# Patient Record
Sex: Female | Born: 1935 | Race: White | Hispanic: No | Smoking: Never smoker
Health system: Southern US, Community
[De-identification: ages and names within clinical notes are randomized; demographics above are authoritative.]

## PROBLEM LIST (undated history)

## (undated) DIAGNOSIS — I5042 Chronic combined systolic (congestive) and diastolic (congestive) heart failure: Secondary | ICD-10-CM

## (undated) DIAGNOSIS — N183 Chronic kidney disease, stage 3 unspecified: Secondary | ICD-10-CM

## (undated) DIAGNOSIS — I639 Cerebral infarction, unspecified: Secondary | ICD-10-CM

## (undated) DIAGNOSIS — E876 Hypokalemia: Secondary | ICD-10-CM

## (undated) DIAGNOSIS — I482 Chronic atrial fibrillation, unspecified: Secondary | ICD-10-CM

## (undated) DIAGNOSIS — G459 Transient cerebral ischemic attack, unspecified: Secondary | ICD-10-CM

## (undated) DIAGNOSIS — M1990 Unspecified osteoarthritis, unspecified site: Secondary | ICD-10-CM

## (undated) DIAGNOSIS — J45909 Unspecified asthma, uncomplicated: Secondary | ICD-10-CM

## (undated) DIAGNOSIS — I10 Essential (primary) hypertension: Secondary | ICD-10-CM

## (undated) HISTORY — PX: OTHER SURGICAL HISTORY: SHX169

## (undated) HISTORY — PX: JOINT REPLACEMENT: SHX530

---

## 1997-08-16 ENCOUNTER — Other Ambulatory Visit: Admission: RE | Admit: 1997-08-16 | Discharge: 1997-08-16 | Payer: Self-pay | Admitting: Family Medicine

## 1999-03-15 ENCOUNTER — Other Ambulatory Visit: Admission: RE | Admit: 1999-03-15 | Discharge: 1999-03-15 | Payer: Self-pay | Admitting: Family Medicine

## 1999-04-10 ENCOUNTER — Encounter: Payer: Self-pay | Admitting: Family Medicine

## 1999-04-10 ENCOUNTER — Encounter: Admission: RE | Admit: 1999-04-10 | Discharge: 1999-04-10 | Payer: Self-pay | Admitting: Family Medicine

## 2000-05-28 ENCOUNTER — Encounter: Payer: Self-pay | Admitting: Family Medicine

## 2000-05-28 ENCOUNTER — Encounter: Admission: RE | Admit: 2000-05-28 | Discharge: 2000-05-28 | Payer: Self-pay | Admitting: Family Medicine

## 2001-03-26 ENCOUNTER — Other Ambulatory Visit: Admission: RE | Admit: 2001-03-26 | Discharge: 2001-03-26 | Payer: Self-pay | Admitting: Family Medicine

## 2001-05-29 ENCOUNTER — Encounter: Payer: Self-pay | Admitting: Family Medicine

## 2001-05-29 ENCOUNTER — Encounter: Admission: RE | Admit: 2001-05-29 | Discharge: 2001-05-29 | Payer: Self-pay | Admitting: Family Medicine

## 2002-07-01 ENCOUNTER — Encounter: Payer: Self-pay | Admitting: Family Medicine

## 2002-07-01 ENCOUNTER — Encounter: Admission: RE | Admit: 2002-07-01 | Discharge: 2002-07-01 | Payer: Self-pay | Admitting: Family Medicine

## 2002-10-13 ENCOUNTER — Encounter: Payer: Self-pay | Admitting: Family Medicine

## 2002-10-13 ENCOUNTER — Encounter: Admission: RE | Admit: 2002-10-13 | Discharge: 2002-10-13 | Payer: Self-pay | Admitting: Family Medicine

## 2003-08-18 ENCOUNTER — Encounter: Admission: RE | Admit: 2003-08-18 | Discharge: 2003-08-18 | Payer: Self-pay | Admitting: Family Medicine

## 2004-08-09 ENCOUNTER — Ambulatory Visit (HOSPITAL_COMMUNITY): Admission: RE | Admit: 2004-08-09 | Discharge: 2004-08-09 | Payer: Self-pay | Admitting: Anesthesiology

## 2004-08-30 ENCOUNTER — Encounter: Admission: RE | Admit: 2004-08-30 | Discharge: 2004-08-30 | Payer: Self-pay | Admitting: Orthopedic Surgery

## 2004-10-17 ENCOUNTER — Encounter: Admission: RE | Admit: 2004-10-17 | Discharge: 2004-10-17 | Payer: Self-pay | Admitting: Family Medicine

## 2004-12-06 ENCOUNTER — Inpatient Hospital Stay (HOSPITAL_COMMUNITY): Admission: RE | Admit: 2004-12-06 | Discharge: 2004-12-09 | Payer: Self-pay | Admitting: Neurosurgery

## 2005-02-07 ENCOUNTER — Inpatient Hospital Stay (HOSPITAL_COMMUNITY): Admission: RE | Admit: 2005-02-07 | Discharge: 2005-02-13 | Payer: Self-pay | Admitting: Orthopedic Surgery

## 2005-07-23 ENCOUNTER — Encounter: Admission: RE | Admit: 2005-07-23 | Discharge: 2005-07-23 | Payer: Self-pay | Admitting: Neurosurgery

## 2005-10-19 ENCOUNTER — Encounter: Admission: RE | Admit: 2005-10-19 | Discharge: 2005-10-19 | Payer: Self-pay | Admitting: Family Medicine

## 2006-11-14 ENCOUNTER — Encounter: Admission: RE | Admit: 2006-11-14 | Discharge: 2006-11-14 | Payer: Self-pay | Admitting: Family Medicine

## 2007-04-28 ENCOUNTER — Inpatient Hospital Stay (HOSPITAL_COMMUNITY): Admission: RE | Admit: 2007-04-28 | Discharge: 2007-05-02 | Payer: Self-pay | Admitting: Orthopedic Surgery

## 2007-11-26 ENCOUNTER — Encounter: Admission: RE | Admit: 2007-11-26 | Discharge: 2007-11-26 | Payer: Self-pay | Admitting: Family Medicine

## 2007-12-05 ENCOUNTER — Encounter: Admission: RE | Admit: 2007-12-05 | Discharge: 2007-12-05 | Payer: Self-pay | Admitting: Family Medicine

## 2008-06-11 ENCOUNTER — Encounter: Admission: RE | Admit: 2008-06-11 | Discharge: 2008-06-11 | Payer: Self-pay | Admitting: Family Medicine

## 2008-11-29 ENCOUNTER — Encounter: Admission: RE | Admit: 2008-11-29 | Discharge: 2008-11-29 | Payer: Self-pay | Admitting: Family Medicine

## 2009-12-02 ENCOUNTER — Encounter: Admission: RE | Admit: 2009-12-02 | Discharge: 2009-12-02 | Payer: Self-pay | Admitting: Family Medicine

## 2010-06-20 NOTE — Op Note (Signed)
[name], [name]              ACCOUNT NO.:  [id number]   MEDICAL RECORD NO.:  [id number]          PATIENT TYPE:  INP   LOCATION:  2899                         FACILITY:  [facility]   PHYSICIAN:  Mila Homer. Sherlean Foot, M.D. DATE OF BIRTH:  02/16/1918   DATE OF PROCEDURE:  04/28/2007  DATE OF DISCHARGE:                               OPERATIVE REPORT   SURGEON:  Mila Homer. Sherlean Foot, M.D.   ASSISTANTArlys John D. Petrarca, P.A.-C.   ANESTHESIA:  General.   PREOPERATIVE DIAGNOSIS:  Right hip osteoarthritis.   POSTOPERATIVE DIAGNOSIS:  Right hip osteoarthritis.   PROCEDURE:  Right total arthroplasty.   INDICATIONS FOR PROCEDURE:  The patient is an 77 year old white female  with failure of conservative measures for right hip osteoarthritis.  She  had already had a well functioning left hip done by myself and done 2  years ago.  Informed consent was obtained.   DESCRIPTION OF PROCEDURE:  The patient was placed supine under general  anesthesia, and then placed in the left lateral decubitus position.  The  hip was prepped and draped in the usual sterile fashion.   An incision was made approximately 6 inches in length with a #10 blade.  Cautery was used to obtain hemostasis.  I then incised the fascia lata  along the length of the incision with the cautery in place, and external  retractor in place.  I then created an anterior sleeve of tissue of the  anterior 1/2 of the gluteus medius, vastus lateralis, and minimus  tendon; tied with 3 stay sutures, and elevated anteriorly.  I then  performed an anterior capsulectomy.  I then used the cutting guide and  marked out the femoral neck cut, and made that cut with a reciprocating  saw.   I then placed retractors anterior and posterior to the acetabulum, and  switched sides of the table with my PA.  I removed the labrum  circumferentially, and then sequentially reamed up to 52 mm.  I put a 52  trial in place, liked the fit, so I put a 54-mm no  holes, no spiked cup.  This was a fiber mesh 54 mm Zimmer cup.  I then placed a neutral liner  accepting of a 32 mm head.  I then switched back to the back side of the  table.  We flexed the leg into a sterile pouch off the anterior side of  the table.   I then used a Mueller retractor to elevate the cut surface of the  femoral neck, then used the canal finder and side-biting reamer.  I then  sequentially reamed up to 14 mm, broached to 14, and trialed with a #0  head and had good stability.  I then removed the trial component, and  copiously irrigated.  I then put down a fully porous coated size 14  implant, and tamped a +0 x 32 mm ball onto the clean Morse taper and  relocated the hip.   I then repaired the lateralis medius-minimus sleeve through drill holes  in the trochanter, oversewn with figure-of-eight #2 Tevdek sutures.  I  closed the fascia lata with running #1 Vicryls, deep soft tissues to  buried #0 Vicryl, subcuticular Vicryl stitch, skin staples, and strap  dressing.   ESTIMATED BLOOD LOSS:  300 mL   COMPLICATIONS:  None.   DRAINS:  None.           ______________________________  Mila Homer. Sherlean Foot, M.D.     SDL/MEDQ  D:  04/28/2007  T:  04/28/2007  Job:  [id number]

## 2010-06-23 NOTE — Op Note (Signed)
[name], [name]              ACCOUNT NO.:  [id number]   MEDICAL RECORD NO.:  [id number]          PATIENT TYPE:  INP   LOCATION:  5034                         FACILITY:  [facility]   PHYSICIAN:  Mila Homer. Sherlean Foot, M.D. DATE OF BIRTH:  1918/02/18   DATE OF PROCEDURE:  02/07/2005  DATE OF DISCHARGE:                                 OPERATIVE REPORT   PREOPERATIVE DIAGNOSIS:  Left hip avascular necrosis.   POSTOPERATIVE DIAGNOSIS:  Left hip avascular necrosis.   OPERATION/PROCEDURE:  Left total hip arthroplasty.   SURGEON:  Mila Homer. Sherlean Foot, M.D.   ASSISTANT:  __________  P.A.-C.   ANESTHESIA:  General.   INDICATIONS:  The patient is an 77 year old white female with medical  clearance, failure of conservative measures of osteoarthritis and an  informed consent obtained.   DESCRIPTION OF PROCEDURE:  The patient was taken to the operating room and  administered general anesthesia. She was then administered Foley  catheterization.  She was then placed in the right-down, left-up lateral  decubitus position.  At this point the left hip was prepped and draped in  the usual sterile fashion.  A #10 blade was used to make a curvilinear  incision and centered on the greater trochanter.  I then used a cautery to  obtain his hemostasis.  I then split the fascia lata along the length of the  incision and held that interval of open with a Charnley retractor.  At this  point, abductors and vastus medialis was identified.  The anterior one-half  of the vastus medialis was taken off of the bone sharply with a #10 blade  and a sleeve with the anterior one-third of the gluteus medius and all the  gluteus minimus and these were tagged with #2 Tevdek sutures.  I then  externally rotated the leg and could easily palpate the hip joint.  I then  performed an anterior hip capsulectomy.  At this point I marked out the neck  cut with the cutting guide.  I used a reciprocating saw to make the femoral  neck  cut and removed the neck and head without dislocating the hip.  I then  changed sides of the table with my P.A.-C.  I then placed a Homan rectractor  anterior and posterior to the acetabulum and cleaned out the acetabulum of  its labrum and ligamentum teres.  I then sequentially reamed from 46 mm up  to 52 mm.  At this point I put a 52 trial in.  It had excellent fit.  I then  chose a 54 mm cup and tamped in a fibromesh cup with no holes and no spikes.  At this point I switched back to the back side of the table.  We externally  rotated the femur off into a sterile pouch on the anterior side of the  table.  I used the Muller retractor to elevate the cut surface of the  femoral neck up out of the wound.  I then used the canal finder to find the  femoral canal.  I used the side-biting reamer to ream laterally into the  trochanter and then sequentially reamed up to 14 mm with the reamers.  I  then broached sequentially from 11 up to 14.  Had good rotational fit.  I  then used the __________  planer and trialed with multiple heads with a  standard liner already in place.  At this point, with this 3 + 3 size head  in place, this afforded excellent stability and recreated normal leg length.  I then dislocated the hip, removed the trial prosthesis and irrigated  copiously.  I then tamped down a fully porous-coated, size 14 stem in  neutral version.  This matched the anatomic version of the acetabulum.  I  then tamped on a +3, 5 x 32 mm head and located the hip.  I again took it  through a trial and could not dislocate the hip.  At this point I repaired  the vastus lateralis and gluteus medius and minimus sleeve through drill  holes in the trochanter and then oversewed that interval with several figure-  of-eight Tevdek #2 sutures.  I then closed the fascia lata with a running  #1 Vicryl.  Closed the deep soft tissue with interrupted 0 Vicryls, a  subcuticular 2-0 Vicryl layer and skin staples.  I  dressed it with Adaptic,  4 x 4's, ABDs, and sterile Ioban drape.  Estimated blood loss was 300 mL.  No complications.  No drains.           ______________________________  Mila Homer Sherlean Foot, M.D.     SDL/MEDQ  D:  02/07/2005  T:  02/08/2005  Job:  [id number]

## 2010-06-23 NOTE — Op Note (Signed)
[name], [name]              ACCOUNT NO.:  [id number]   MEDICAL RECORD NO.:  [id number]          PATIENT TYPE:  INP   LOCATION:  3314                         FACILITY:  [facility]   PHYSICIAN:  Cristi Loron, M.D.DATE OF BIRTH:  February 03, 1919   DATE OF PROCEDURE:  12/06/2004  DATE OF DISCHARGE:                                 OPERATIVE REPORT   PREOPERATIVE DIAGNOSIS:  L1-2, 2-3, 3-4, 4-5, 5-1 spinal stenosis,  degenerative disk disease, lumbar radiculopathy with lumbago.   POSTOPERATIVE DIAGNOSIS:  L1-2, 2-3, 3-4, 4-5, 5-1 spinal stenosis,  degenerative disk disease, lumbar radiculopathy with lumbago.   OPERATION PERFORMED:  L1, 2, 3, 4, and 5 decompressive laminectomy and  bilateral foraminotomy using microdissection.   SURGEON:  Cristi Loron, M.D.   ASSISTANT:  Hewitt Shorts, M.D.   ANESTHESIA:  General endotracheal.   ESTIMATED BLOOD LOSS:  200 mL.   SPECIMENS:  None.   DRAINS:  None.   COMPLICATIONS:  None.   INDICATIONS FOR PROCEDURE:  The patient is an 77 year old white female who  suffered from back and bilateral hip and leg pain consistent with neurogenic  claudication.  She was worked up with a lumbar MRI which demonstrated she  had spinal stenosis from L1-2 all the way down to L5-S1.  I discussed the  various treatment options with the patient and her family including surgery.  The patient has weighed the risks, benefits and alternatives to surgery and  decided to proceed with decompressive lumbar laminectomy.   DESCRIPTION OF PROCEDURE:  The patient was brought to the operating room by  the anesthesia team.  General endotracheal anesthesia was induced.  The  patient was then carefully turned to the prone position on the Wilson frame.  Her lumbosacral region was then prepared with Betadine scrub and Betadine  solution and sterile drapes were applied.  I then injected the area to be  incised with Marcaine with epinephrine solution.  I used a  scalpel to make a  midline incision from L1 to the upper sacrum.  I used electrocautery to  perform bilateral subperiosteal dissection exposing the spinous process and  lamina from L1 down to the upper sacrum.  We inserted the McCullough  retractor and cerebellar retractor for exposure and then we obtained  intraoperative radiograph to confirm our location.  I then used the scalpel  to incise the L1-2, 2-3, 3-4, 4-5 and 5-1 interspinous ligament and I used a  Leksell rongeur to remove the spinous process at L2, 3, 4, and 5.  We then  used a high speed drill to perform bilateral laminotomies at L5, 4, 3, 2 and  1.  We then brought the operating microscope into the field and under its  magnification and illumination completed the microdissection/decompression.  We then used the Kerrison punch to complete the laminectomies at L5, 4, 3  and 2 and widen the laminotomies at L1.  We removed the ligamentum flavum at  L1-2, 2-3, 3-4, 4-5 and 5-1.  We then performed foraminotomies about the  bilateral L2, 3, 4, 5, and S1 nerve roots removing the excess ligamentum  flavum from the lateral recesses.  We then palpated along the ventral  surface of the thecal sac and inspected the L1-2, 2-3, 3-4, 4-5 and 5-1  intervertebral disks.  They were bulging.  There was no significant  herniations and at this point the thecal sac and the bilateral L2, 3, 4, 5,  and S1 nerve roots were well decompressed.  We then obtained hemostasis  using bipolar electrocautery.  We irrigated the wound out with bacitracin  solution, removed the solution, then removed the retractors and then  reapproximated the patient's thoracolumbar fascia with interrupted #1 Vicryl  sutures, subcutaneous tissue with interrupted 2-0 Vicryl suture and the skin  with Steri-Strips and benzoin. The wound was then coated with bacitracin  ointment, sterile dressing was applied, the drapes were removed.  The  patient was subsequently returned to supine  position where she was extubated  by the anesthesia team and transported to the post anesthesia care unit in  stable condition.  All sponge, needle and instrument counts were correct at  the end of the case.      Cristi Loron, M.D.  Electronically Signed     JDJ/MEDQ  D:  12/06/2004  T:  12/07/2004  Job:  [id number]

## 2010-06-23 NOTE — Discharge Summary (Signed)
[name], [name]              ACCOUNT NO.:  [id number]   MEDICAL RECORD NO.:  [id number]          PATIENT TYPE:  INP   LOCATION:  3704                         FACILITY:  [facility]   PHYSICIAN:  Cristi Loron, M.D.DATE OF BIRTH:  [birth year]   DATE OF ADMISSION:  12/06/2004  DATE OF DISCHARGE:  12/09/2004                                 DISCHARGE SUMMARY   BRIEF HISTORY:  The patient is an 77 year old white female, who suffered  from back and bilateral hip and leg pain consistent with neurogenic  claudication.  She was worked up with a lumbar MRI, which demonstrates she  has spinal stenosis at L1-2, all the way down to L5-S1.  I discussed the  various treatment options with the patient and her family including surgery.  The patient has weighed the risk, benefits, and alternatives to surgery and  has decided to proceed with a decompressive lumbar laminectomy.   For further details of this admission, please refer to the typed History and  Physical.   HOSPITAL COURSE:  I admitted the patient to [facility] on December 06, 2004, with the diagnosis of lumbar spinal stenosis.  In the preop holding  room, the anesthesiologist was concerned about her cardiovascular status so  we got a Cardiology consult.  The patient was kindly seen by Dr. Elsie Lincoln.  They worked her up with an echocardiogram, which turned out okay.  The  patient then was cleared for surgery.  The patient subsequently underwent a  decompressive laminectomy from L1 down to L5 performed by me.  The surgery  went well (for full details of this operation, please refer to typed  Operative Note).   POSTOPERATIVE COURSE:  The patient's postoperative course was remarkable  only as follows.  She did go into atrial fibrillation.  She was seen by the  cardiologist and they treated her medically.  The patient was started on  Cardizem-CD 180 mg p.o. daily, and by December 09, 2004, the patient was felt  to be stable for  discharge to home.   FINAL DIAGNOSES:  1.  Lumbar spinal stenosis.  2.  Degenerative disk disease.  3.  Atrial fibrillation.  4.  Hypertension.  5.  Asthma.   PROCEDURES PERFORMED:  1.  L1 to L5 decompressive laminectomy.  2.  Echocardiogram.      Cristi Loron, M.D.  Electronically Signed     JDJ/MEDQ  D:  01/18/2005  T:  01/21/2005  Job:  [id number]   cc:   Madaline Savage, M.D.  Fax: [phone]

## 2010-06-23 NOTE — H&P (Signed)
[name], DOBESH              ACCOUNT NO.:  [id number]   MEDICAL RECORD NO.:  [id number]            PATIENT TYPE:   LOCATION:                                 FACILITY:   PHYSICIAN:  Mila Homer. [name], M.D. DATE OF BIRTH:  1918-04-04   DATE OF ADMISSION:  02/07/2005  DATE OF DISCHARGE:                                HISTORY & PHYSICAL   IDENTIFICATION:  [name] is an 77 year old white widowed female,  retired Optician, dispensing.   CHIEF COMPLAINT:  Painful left hip.   HISTORY OF PRESENT ILLNESS:  This patient has had increasing left hip pain  over the past 6 months which has worsened after her spine surgery in  November of 2006.  She has had injections intra-articularly which has helped  only transiently.  She has now failed conservative treatment and is  indicated for operative intervention.  Radiographically, she was noted to  have avascular necrosis of her left hip.  Now indicated for left total hip  arthroplasty.   PAST MEDICAL HISTORY:  In general, health is good.   HOSPITALIZATIONS:  December 06, 2004 for lumbar surgery as above.   MEDICATIONS:  1.  Vicodin 5/500 one q.4h. p.r.n. pain.  2.  Aleve one every 4 hours p.r.n.  3.  Generic eye drops for FML four times daily.  4.  Caltrate 600 plus D daily.  5.  Advair p.r.n.  6.  Vaseretic 10/25 mg tablets daily.  7.  Cardizem 120 mg tablets daily.  8.  Aspirin one daily.   ALLERGIES:  None known to drugs.  She does have an allergy to shrimp   REVIEW OF SYSTEMS:  A 14-point review of systems is positive for  hypertension, for which she takes Vaseretic and Cardizem.  She also has a  history of asthma, for which she uses Advair, possibly only once a week.  She does have partial dentures, and she does wear glasses.  All other  symptomatology is denied.   FAMILY HISTORY:  Mother died from heart failure, and father died from kidney  disease.  One deceased brother and 1 sister who is alive.   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  She denies the use of  tobacco or alcohol.  Her family physician is Dr. Cira Rue Hamrick.  Phone  number is [phone].   PHYSICAL EXAMINATION:  GENERAL:  An 77 year old white female, well-  developed, well-nourished, alert, pleasant, cooperative, in moderate  distress secondary to left hip and groin pain.  VITAL SIGNS:  She is 5 feet, 3 inches, weight 155 pounds.  Temperature is  98.5, pulse 80, respirations 18, and blood pressure 130/72.  HEENT:  Head is normocephalic.  Eyes - pupils equal, round and reactive to  light and accommodation.  Extraocular movements intact.  Ears, nose and  throat were benign.  NECK:  Supple.  No thyromegaly.  CHEST:  Good expansion.  LUNGS:  Essentially clear.  CARDIAC:  Regular rhythm and rate.  Normal S1 and S2.  No discrete murmurs,  rubs or gallops appreciated.  Pulses were 1+ bilateral symmetric in the  lower extremities.  ABDOMEN:  Obese, soft, nontender.  No masses palpable.  Normal bowel sounds  present.  GENITAL/RECTAL/BREAST:  Not performed and not indicated for the procedure.  CNS:  She is oriented x3.  Cranial nerves II-XII grossly intact.  MUSCULOSKELETAL:  She has decreased range of motion of the left hip.  She  barely has 10 degrees of internal and external rotation at best.  She has  flexion to 95 degrees.  Sensation is intact to light touch distally.  Calf  is supple, nontender.   CLINICAL IMPRESSION:  1.  Avascular necrosis of the left hip.  2.  History of hypertension.  3.  History of asthma.   RECOMMENDATIONS:  At this time, we feel that she is a candidate for total  hip arthroplasty on the left.  The procedure, risks and benefits have been  fully explained.  She is understanding.  She will proceed with surgical  intervention in the near future.      Oris Drone Petrarca, P.A.-C.    ______________________________   Mila Homer. [name], M.D.    BDP/MEDQ  D:  01/26/2005  T:  01/27/2005  Job:  [id number]

## 2010-06-23 NOTE — Discharge Summary (Signed)
NAMEREANNE, Carol              ACCOUNT NO.:  [id number]   MEDICAL RECORD NO.:  [id number]          PATIENT TYPE:  INP   LOCATION:  2030                         FACILITY:  [facility]   PHYSICIAN:  Mila Homer. Sherlean Foot, M.D. DATE OF BIRTH:  Apr 05, 1918   DATE OF ADMISSION:  02/07/2005  DATE OF DISCHARGE:  02/13/2005                                 DISCHARGE SUMMARY   ADMISSION DIAGNOSIS:  Avascular necrosis, left hip.   DISCHARGE DIAGNOSES:  1.  Avascular necrosis, left hip.  2.  Atrial fibrillation.  3.  Chronic airway obstructive disease.  4.  Acute postoperative anemia.  5.  Hypertension.   PROCEDURE:  Left total hip replacement.   HISTORY:  This patient has had increased left hip pain over the past 6  months which has worsened after her spine surgery in November 2006.  She had  an intra-articular injection which helped only transiently.  She has now  failed conservative treatment and is now indicated for operative  intervention.  Radiographically, she has avascular necrosis of the left hip  and is now indicated for left total hip arthroplasty.   HOSPITAL COURSE:  [age]-year-old female admitted February 07, 2005.  After appropriate laboratory studies were obtained and 1 g of Ancef IV on  call to the operating room, she was taken to the operating room where she  underwent a left total hip replacement.  She tolerated the procedure.  A  Foley was placed intraoperatively.  A Dilaudid PCA pump in a reduced dose  was used for postop pain management.  She was continued on Ancef 1 g IV q.8  h. x3 doses.  Abduction pillow was used postoperatively.  She was started on  Lovenox 30 mg subcu q.12 h.  Consultation with PT and Care Management were  performed.  She was allowed to be weightbearing as tolerated in Physical  Therapy.  On the 4th, she was weaned to oral pain management and pills.  She  was also instructed in Lovenox.  STAT EKG was obtained on February 09, 2005  revealing atrial  fibrillation.  She was then placed on 30 mg or Cardizem  p.o. x1.  She was transferred to Telemetry and the therapeutic dose of  Lovenox for atrial fibrillation was started.  The long-acting Cardizem was  discontinued and she was started on Cardizem 60 mg p.o. q.6 h.  Lopressor 25  mg twice daily was also begun.  Portable chest x-ray was ordered.  Magnesium  sulfate of 1 g was to be given on February 10, 2005.  Lovenox 70 mg subcu q.12  h. was the dosing for the therapeutic Lovenox.  On the 7th, she had an  additional 2 g of magnesium sulfate given.  Potassium chloride of 40 mEq was  also given that day.  The remainder of her hospital course was uneventful  and we were able to discharge on the 9th after clearance by Cardiology.  She  was discharged in improved condition.   ACCESSORY CLINICAL DATA:  EKG of February 09, 2005 revealed atrial  fibrillation with rapid ventricular response, nonspecific ST and T wave  abnormalities, probably digitalis effect.  When compared with EKG of  December 09, 2004, the previous EKG changes were present.  Since last  tracing, there was conversion back to atrial fibrillation with rapid  ventricular response.   Chest x-ray, December 01, 2004, revealed no cardiopulmonary disease.   Pelvis of February 07, 2005 revealed anatomic alignment, post left hip  arthroplasty without acute complicating features, moderate osteoarthritis in  the right hip.   Portable chest of February 09, 2005 reveals cardiomegaly and pulmonary  vascular congestion.   LABORATORY STUDIES:  Admitted with a hemoglobin of 14.1, hematocrit 42.1%,  white count 9200 and platelets 355,000.  Discharge hemoglobin was 9.1,  hematocrit 26.8%, white count 9200 and platelets were 372,000.  Preop pro  time was 13.6 with an INR of 1.0 and a PTT of 28.  Pro time of February 13, 2005 revealed PT of 0.4 seconds with an INR of 1.5.  Preop chemistries  revealed a sodium of 142, potassium 4.0, chloride 102, CO2 29,  glucose 111,  BUN 35, creatinine 1.2, calcium 10.2, total protein 6.5, albumin 4.0, AST  23, ALT 17, ALP 73, total bilirubin 1.0.  Discharge sodium was 133,  potassium 3.6, chloride 99, CO2 28, glucose 105, BUN 12, creatinine 0.9,  calcium 9.0.  Magnesium of February 09, 2005 was 1.3, on February 11, 2005 was  1.4, on February 12, 2005 was 1.8.  BNP of February 09, 2005 revealed 222.0.  TSH the same day was 3.886.  Urinalysis of February 01, 2005 was noted to  have 3-6 white cells, no red cells, a few bacteria.  UA of February 07, 2005  revealed moderate leukocyte esterase with rare epithelials and 3-6 white  cells.  Blood type was O-negative, antibody screen negative.   DISCHARGE MEDICATIONS:  1.  Given a prescription for Percocet 5/325 mg one or two tabs every 4 hours      as needed for pain.  2.  Coumadin as directed by Dr. Elsie Lincoln; 5-mg tablets were ordered.  3.  Lovenox 20 mg -- inject subcutaneously every 6 a.m. and 6 p.m. until      Coumadin is therapeutic.  4.  Robaxin 500 mg one to two tabs every 6 hours as needed for spasm.  5.  Lopressor 50 mg one twice a day.  6.  Cardizem CD 300 mg one daily.   DISCHARGE INSTRUCTIONS:  Rehab and home health through [facility] for PT were  ordered.   DIET:  There were no restrictions on diet.   ACTIVITY:  No driving or lifting for 4-6 weeks.  Crutches, weightbearing as  tolerated on the left hip.   FOLLOWUP:  She will follow back up with Dr. Sherlean Foot 2 weeks from surgery, with  Dr. Elsie Lincoln on March 06, 2005 at 2:45 p.m.   CONDITION ON DISCHARGE:  Discharged in improved condition.      Oris Drone Petrarca, P.A.-C.    ______________________________  Mila Homer. Sherlean Foot, M.D.    BDP/MEDQ  D:  04/09/2005  T:  04/10/2005  Job:  [id number]

## 2010-06-23 NOTE — Discharge Summary (Signed)
[name], [name]              ACCOUNT NO.:  [id number]   MEDICAL RECORD NO.:  [id number]          PATIENT TYPE:  INP   LOCATION:  5035                         FACILITY:  [facility]   PHYSICIAN:  [name], M.D. DATE OF BIRTH:  06-02-1918   DATE OF ADMISSION:  04/28/2007  DATE OF DISCHARGE:  05/02/2007                               DISCHARGE SUMMARY   ADMISSION DIAGNOSES:  1. End-stage osteoarthritis right hip status post left hip      replacement.  2. Hypertension.  3. Chronic atrial fibrillation on Coumadin.  4. Chronic low back pain.  5. 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  6. Preoperative urinary tract infection on Macrodantin.   DISCHARGE DIAGNOSES:  1. End-stage osteoarthritis, right hip status post right total hip      arthroplasty.  2. History of left total hip arthroplasty.  3. Acute blood loss anemia, secondary to surgery.  4. Preoperative urinary tract infection with intolerance to      Macrodantin, now on Cipro.  5. Chronic atrial fibrillation with increased heart rate.  6. Hyponatremia.  7. Constipation.  8. Leukocytosis, now improved.  9. Hypertension.  10.Chronic low back pain.  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   SURGICAL PROCEDURES:  On April 28, 2007, [name] underwent a right  total hip arthroplasty by Dr. Raymon Mutton assisted by Jacqualine Code, PA-C.  She had a trilogy acetabular system shell without  holes, size 54 mm outer diameter placed with a trilogy liner longevity  cross-linked poly 32-mm inner diameter, 50-54 mm outer diameter.  A  VerSys femoral stem beaded full coat collared 12/14 neck taper, standard  neck offset size 14 body femoral stem with a VerSys femoral head 12/14  taper 32-mm diameter of +0 neck length.   COMPLICATIONS:  None.   CONSULTANT:  1. Pharmacy consult for Coumadin therapy April 28, 2007.  2. Cardiology consult by Dr. Orvan Falconer, April 29, 2007, in addition to      a physical  therapy consult.  3. Case management occupational therapy consult, April 30, 2007.   HISTORY OF PRESENT ILLNESS:  This is an 77 year old white female.  The  patient presented to Dr. Sherlean [name] with history of a left hip replacement in  January 2007 and lumbar surgery in November 2006.  She has had a 23-month  history of gradual onset progressive right hip, pain with no injury or  surgery.  Pain is a constant ache in the right buttock with radiation  into the side past the knee.  The hip pops, grinds, gives that she  cannot sleep on the right side.  Pain increases with prolonged walking  and decreases with rest and Lorcet.  She had been using a cane,  but now  using a walker due to the pain.  She has failed conservative treatment  and x-rays show end-stage arthritic changes of the hip.  Because of this  she is presenting for a right hip replacement.   HOSPITAL COURSE:  [name] tolerated her surgical procedure well,  without immediate postoperative complications.  She was transferred  to  5000.  She was resumed back on her Coumadin covered with Lovenox still  the Coumadin was therapeutic.  Postop day #1 T-max was 99.5, pulse was  up at 124.  White count 12.8, hemoglobin 11.7, hematocrit 34.7.  Mepilex  is intact at the right hip.  She noted to tell us at that time that she  could not tolerate a Macrodantin for her preop UTI and was switched to  Cipro at that time.  Repeat UA and C&S were obtained and Dr. Elsie Lincoln was  consulted due to the elevated heart rate with her Afib.  They did not  feel telemetry bed was necessary at that time, so she was kept on the  orthopedic floor.   Postop day #2 T-max 101.4, vitals were stable.  White count up at 18,  hemoglobin 11.7, hematocrit 34.8.  PT 20.3, INR 1.7.  Leg was  neurovascularly intact.  She was switched to p.o. pain meds and  continued on therapy.   The patient did make slow progress over the next several days.  Her  heart rate seemed to  stabilize.  No further cardiac intervention was  needed.  On March 26, her T-max was 99.1, pulse 102, vitals were stable.  White count had dropped to 14.7, hemoglobin 10.6, hematocrit 31.  She  was having some issues with constipation that was treated with laxatives  and plans were made for probable discharge to home the next day.   On the 27th, her pain was well controlled.  T-max 99.6, vitals stable.  White count continued to fall at 11.7, hemoglobin 10.1, hematocrit 30.4,  right hip incision was well approximated with staples.  Mepilex dressing  was changed.  She was doing well enough with therapy that was I was felt  she was ready for discharge to home.  Constipation has been treated  effectively the day before and she was able to be discharged to home at  that time.   DISCHARGE INSTRUCTIONS:   DIET:  She is to resume her regular prehospitalization diet.   MEDICATIONS:  She may resume her home meds as follows:  1. No hydrocodone __________  at this time.  2. Diltiazem 360 mg p.o. q.a.m.  3. Amlodipine 5 mg p.o. q.a.m.  4. Coumadin dosing at this time.  5. Lovenox 60 mg nightly.  She is to hold at this time.  6. Caltrate 1 tablet p.o. q.a.m.  7. Prednisolone eye drops 1 drop in right eye daily, she is to      continue.  8. Metoprolol 50 mg half a tablet p.o. b.i.d.  9. Lisinopril 10 mg p.o. q.a.m.   ADDITIONAL MEDICATIONS:  At this time include:  1. Coumadin 2.5 mg p.o. q.a.m. with no Coumadin on Tuesdays.  2. Cipro 250 mg p.o. b.i.d. 10 with no refill.  3. Norco 5/325 1-2 tablets p.o. q.4 h. p.r.n. for pain, 60, with no      refill.   ACTIVITY:  She can be out of bed.  Weightbearing as tolerated on the  right leg with use of walker.  No lifting or driving for 6 weeks and she  is to increase her activity slowly.  She is to have home health PT per  [facility].  Please see her blue total hip discharge sheet  for further activity instructions.   WOUND CARE:   She may shower after no drainage from the wound for 2 days.  Please see the blue total hip discharge sheet  for further wound care  instructions.   FOLLOW-UP:  She is to follow up with Dr. Sherlean [name] in our office on Tuesday  April 7 and needs to call [phone] for that appointment.  She is to  follow up with her cardiologist Dr. Elsie Lincoln in 1 month.   LABORATORY DATA:  X-ray taken of the right hip on March 23, showed the  right hip, femoral and acetabular components in satisfactory position  with no appearing prostatic fractures.   Hemoglobin/hematocrit ranged from 15.1 and 45.2 on the 19th to 10.1 and  30.4 on the 27th.  White count went from 10.5 on the 19th to 18 on the  25th to 11.7 on the 27th.  Platelets remained within normal limits.   PT and INR were 51.2 on the 19th and then went to 24.7 and 2.2 on the  27th.   Sodium dropped to a low of 134 on the 25th.  Glucose ranged from 120 on  the 19th, to 152 on the 24th, to 110 on the 26th.  BUN and creatinine  were 28 and 1.02 on the 19th and then were within normal limits.  Estimated GFR on the 19th was 1551 and the rest of the time it was  within normal limits.   Urinalysis on March 19, showed cloudy, amber urine, 100 megs per dL  protein, positive nitrate, large leukocyte esterase, few epithelials,  too numerous to count white cells, too red cells and many bacteria.  Repeat UA on the 24th showed 30 megs per dL protein, negative nitrate,  small leukocyte esterase, rare epithelials, 7-10 white cells, 3-6 red  cells and rare bacteria.  There was no growth from the urine cultures.      Legrand Pitts Duffy, P.A.    ______________________________  [name], M.D.    KED/MEDQ  D:  05/26/2007  T:  05/27/2007  Job:  [id number]   cc:   Madaline Savage, M.D.

## 2010-10-30 LAB — URINE MICROSCOPIC-ADD ON

## 2010-10-30 LAB — CBC
HCT: 31 — ABNORMAL LOW
HCT: 34.7 — ABNORMAL LOW
HCT: 34.8 — ABNORMAL LOW
HCT: 45.2
Hemoglobin: 10.1 — ABNORMAL LOW
Hemoglobin: 11.7 — ABNORMAL LOW
Hemoglobin: 11.7 — ABNORMAL LOW
MCHC: 33.3
MCHC: 33.4
MCHC: 33.6
MCHC: 34.3
MCV: 87.4
MCV: 87.6
MCV: 88.1
Platelets: 208
Platelets: 313
RDW: 14.1
RDW: 14.3
RDW: 14.3
RDW: 14.6
WBC: 10.5

## 2010-10-30 LAB — URINALYSIS, ROUTINE W REFLEX MICROSCOPIC
Bilirubin Urine: NEGATIVE
Glucose, UA: NEGATIVE
Glucose, UA: NEGATIVE
Hgb urine dipstick: NEGATIVE
Ketones, ur: NEGATIVE
Nitrite: POSITIVE — AB
Urobilinogen, UA: 1
pH: 6.5

## 2010-10-30 LAB — BASIC METABOLIC PANEL
BUN: 14
CO2: 28
CO2: 28
CO2: 29
Chloride: 102
Chloride: 104
Chloride: 98
Creatinine, Ser: 0.73
Creatinine, Ser: 0.76
Glucose, Bld: 142 — ABNORMAL HIGH
Glucose, Bld: 152 — ABNORMAL HIGH
Potassium: 3.8
Sodium: 134 — ABNORMAL LOW
Sodium: 138

## 2010-10-30 LAB — DIFFERENTIAL
Basophils Absolute: 0
Eosinophils Absolute: 0.1
Eosinophils Relative: 1
Monocytes Absolute: 0.6
Monocytes Relative: 6
Neutrophils Relative %: 61

## 2010-10-30 LAB — APTT: aPTT: 31

## 2010-10-30 LAB — COMPREHENSIVE METABOLIC PANEL
AST: 25
Albumin: 4.2
Alkaline Phosphatase: 108
CO2: 27
Glucose, Bld: 120 — ABNORMAL HIGH
Total Bilirubin: 1

## 2010-10-30 LAB — PROTIME-INR
INR: 1
INR: 1.2
INR: 2.2 — ABNORMAL HIGH
Prothrombin Time: 22.3 — ABNORMAL HIGH
Prothrombin Time: 24.7 — ABNORMAL HIGH

## 2010-10-30 LAB — CROSSMATCH: Antibody Screen: NEGATIVE

## 2010-10-30 LAB — URINE CULTURE: Colony Count: NO GROWTH

## 2010-11-06 ENCOUNTER — Other Ambulatory Visit: Payer: Self-pay | Admitting: Family Medicine

## 2010-11-06 DIAGNOSIS — Z1231 Encounter for screening mammogram for malignant neoplasm of breast: Secondary | ICD-10-CM

## 2010-12-04 ENCOUNTER — Ambulatory Visit
Admission: RE | Admit: 2010-12-04 | Discharge: 2010-12-04 | Disposition: A | Discharge status: Home / Self Care | Payer: Medicare Other | Source: Ambulatory Visit | Attending: Family Medicine | Admitting: Family Medicine

## 2010-12-04 DIAGNOSIS — Z1231 Encounter for screening mammogram for malignant neoplasm of breast: Secondary | ICD-10-CM

## 2011-03-19 ENCOUNTER — Encounter (HOSPITAL_COMMUNITY): Payer: Self-pay

## 2011-03-19 ENCOUNTER — Other Ambulatory Visit: Payer: Self-pay

## 2011-03-19 ENCOUNTER — Inpatient Hospital Stay (HOSPITAL_COMMUNITY)
Admission: EM | Admit: 2011-03-19 | Discharge: 2011-03-23 | DRG: 308 | Disposition: A | Discharge status: Skilled Nursing Facility | Payer: Medicare Other | Attending: Internal Medicine | Admitting: Internal Medicine

## 2011-03-19 ENCOUNTER — Emergency Department (HOSPITAL_COMMUNITY): Payer: Medicare Other

## 2011-03-19 DIAGNOSIS — J45909 Unspecified asthma, uncomplicated: Secondary | ICD-10-CM | POA: Diagnosis present

## 2011-03-19 DIAGNOSIS — R531 Weakness: Secondary | ICD-10-CM

## 2011-03-19 DIAGNOSIS — N390 Urinary tract infection, site not specified: Secondary | ICD-10-CM | POA: Diagnosis present

## 2011-03-19 DIAGNOSIS — R4701 Aphasia: Secondary | ICD-10-CM | POA: Diagnosis present

## 2011-03-19 DIAGNOSIS — Z7901 Long term (current) use of anticoagulants: Secondary | ICD-10-CM

## 2011-03-19 DIAGNOSIS — I10 Essential (primary) hypertension: Secondary | ICD-10-CM | POA: Diagnosis present

## 2011-03-19 DIAGNOSIS — E876 Hypokalemia: Secondary | ICD-10-CM | POA: Diagnosis not present

## 2011-03-19 DIAGNOSIS — M129 Arthropathy, unspecified: Secondary | ICD-10-CM | POA: Diagnosis present

## 2011-03-19 DIAGNOSIS — I5031 Acute diastolic (congestive) heart failure: Secondary | ICD-10-CM | POA: Diagnosis present

## 2011-03-19 DIAGNOSIS — M1990 Unspecified osteoarthritis, unspecified site: Secondary | ICD-10-CM | POA: Diagnosis present

## 2011-03-19 DIAGNOSIS — I509 Heart failure, unspecified: Secondary | ICD-10-CM | POA: Diagnosis present

## 2011-03-19 DIAGNOSIS — I4891 Unspecified atrial fibrillation: Principal | ICD-10-CM | POA: Diagnosis present

## 2011-03-19 DIAGNOSIS — Z96649 Presence of unspecified artificial hip joint: Secondary | ICD-10-CM

## 2011-03-19 DIAGNOSIS — Z66 Do not resuscitate: Secondary | ICD-10-CM | POA: Diagnosis present

## 2011-03-19 DIAGNOSIS — G2581 Restless legs syndrome: Secondary | ICD-10-CM | POA: Diagnosis present

## 2011-03-19 DIAGNOSIS — D649 Anemia, unspecified: Secondary | ICD-10-CM | POA: Diagnosis present

## 2011-03-19 DIAGNOSIS — G459 Transient cerebral ischemic attack, unspecified: Secondary | ICD-10-CM | POA: Diagnosis not present

## 2011-03-19 HISTORY — DX: Essential (primary) hypertension: I10

## 2011-03-19 HISTORY — DX: Unspecified osteoarthritis, unspecified site: M19.90

## 2011-03-19 LAB — BASIC METABOLIC PANEL
BUN: 28 mg/dL — ABNORMAL HIGH (ref 6–23)
Calcium: 10.2 mg/dL (ref 8.4–10.5)
Chloride: 104 mEq/L (ref 96–112)
Creatinine, Ser: 1.01 mg/dL (ref 0.50–1.10)
GFR calc Af Amer: 55 mL/min — ABNORMAL LOW (ref >90)
GFR calc non Af Amer: 48 mL/min — ABNORMAL LOW (ref >90)

## 2011-03-19 LAB — DIFFERENTIAL
Basophils Absolute: 0 10*3/uL (ref 0.0–0.1)
Basophils Relative: 0 % (ref 0–1)
Eosinophils Absolute: 0 10*3/uL (ref 0.0–0.7)
Eosinophils Relative: 0 % (ref 0–5)
Monocytes Absolute: 0.8 10*3/uL (ref 0.1–1.0)
Monocytes Relative: 6 % (ref 3–12)
Neutro Abs: 10.9 10*3/uL — ABNORMAL HIGH (ref 1.7–7.7)

## 2011-03-19 LAB — CBC
HCT: 41.5 % (ref 36.0–46.0)
MCH: 28.9 pg (ref 26.0–34.0)
MCHC: 31.8 g/dL (ref 30.0–36.0)
RDW: 14.2 % (ref 11.5–15.5)

## 2011-03-19 LAB — TROPONIN I: Troponin I: <0.3 ng/mL (ref <0.30)

## 2011-03-19 LAB — PRO B NATRIURETIC PEPTIDE: Pro B Natriuretic peptide (BNP): 4199 pg/mL — ABNORMAL HIGH (ref 0–450)

## 2011-03-19 LAB — CARDIAC PANEL(CRET KIN+CKTOT+MB+TROPI): Total CK: 343 U/L — ABNORMAL HIGH (ref 7–177)

## 2011-03-19 MED ORDER — ONDANSETRON HCL 4 MG/2ML IJ SOLN: 4.0000 mg | Freq: Four times a day (QID) | INTRAMUSCULAR | Status: DC | PRN

## 2011-03-19 MED ORDER — HYDROCODONE-ACETAMINOPHEN 5-325 MG PO TABS
1.0000 | ORAL_TABLET | Freq: Two times a day (BID) | ORAL | Status: DC | PRN
  Administered 2011-03-19 – 2011-03-23 (×4): 1 via ORAL
  Filled 2011-03-19 (×5): qty 1

## 2011-03-19 MED ORDER — LEVALBUTEROL HCL 0.63 MG/3ML IN NEBU
0.6300 mg | INHALATION_SOLUTION | Freq: Four times a day (QID) | RESPIRATORY_TRACT | Status: DC | PRN
  Administered 2011-03-19: 0.63 mg via RESPIRATORY_TRACT
  Filled 2011-03-19 (×2): qty 3

## 2011-03-19 MED ORDER — ACETAMINOPHEN 325 MG PO TABS
650.0000 mg | ORAL_TABLET | Freq: Four times a day (QID) | ORAL | Status: DC | PRN
  Administered 2011-03-23: 650 mg via ORAL
  Filled 2011-03-19: qty 2

## 2011-03-19 MED ORDER — CALCIUM CARBONATE 1500 (600 CA) MG PO TABS: 1500.0000 mg | ORAL_TABLET | Freq: Once | ORAL | Status: DC

## 2011-03-19 MED ORDER — METOPROLOL TARTRATE 25 MG PO TABS
25.0000 mg | ORAL_TABLET | Freq: Two times a day (BID) | ORAL | Status: DC
  Administered 2011-03-19 – 2011-03-23 (×9): 25 mg via ORAL
  Filled 2011-03-19 (×9): qty 1

## 2011-03-19 MED ORDER — TIOTROPIUM BROMIDE MONOHYDRATE 18 MCG IN CAPS
18.0000 ug | ORAL_CAPSULE | Freq: Every day | RESPIRATORY_TRACT | Status: DC
  Administered 2011-03-21 – 2011-03-23 (×3): 18 ug via RESPIRATORY_TRACT
  Filled 2011-03-19: qty 5

## 2011-03-19 MED ORDER — ACETAMINOPHEN 650 MG RE SUPP: 650.0000 mg | Freq: Four times a day (QID) | RECTAL | Status: DC | PRN

## 2011-03-19 MED ORDER — FUROSEMIDE 10 MG/ML IJ SOLN
40.0000 mg | Freq: Two times a day (BID) | INTRAMUSCULAR | Status: DC
  Administered 2011-03-19 – 2011-03-21 (×4): 40 mg via INTRAVENOUS
  Filled 2011-03-19 (×7): qty 4

## 2011-03-19 MED ORDER — SENNOSIDES-DOCUSATE SODIUM 8.6-50 MG PO TABS
1.0000 | ORAL_TABLET | Freq: Every evening | ORAL | Status: DC | PRN
  Filled 2011-03-19: qty 1

## 2011-03-19 MED ORDER — ASPIRIN EC 81 MG PO TBEC
81.0000 mg | DELAYED_RELEASE_TABLET | Freq: Every day | ORAL | Status: DC
  Administered 2011-03-19 – 2011-03-23 (×5): 81 mg via ORAL
  Filled 2011-03-19 (×5): qty 1

## 2011-03-19 MED ORDER — METOPROLOL TARTRATE 1 MG/ML IV SOLN
5.0000 mg | Freq: Once | INTRAVENOUS | Status: AC
  Administered 2011-03-19: 5 mg via INTRAVENOUS
  Filled 2011-03-19: qty 5

## 2011-03-19 MED ORDER — CALCIUM CARBONATE 1250 (500 CA) MG PO TABS
1.0000 | ORAL_TABLET | Freq: Every day | ORAL | Status: DC
  Administered 2011-03-20 – 2011-03-23 (×4): 500 mg via ORAL
  Filled 2011-03-19 (×4): qty 1

## 2011-03-19 MED ORDER — ONDANSETRON HCL 4 MG PO TABS: 4.0000 mg | ORAL_TABLET | Freq: Four times a day (QID) | ORAL | Status: DC | PRN

## 2011-03-19 MED ORDER — DILTIAZEM HCL 100 MG IV SOLR
5.0000 mg/h | Freq: Once | INTRAVENOUS | Status: AC
  Administered 2011-03-19: 5 mg/h via INTRAVENOUS
  Filled 2011-03-19: qty 100

## 2011-03-19 MED ORDER — DOCUSATE SODIUM 100 MG PO CAPS
100.0000 mg | ORAL_CAPSULE | Freq: Two times a day (BID) | ORAL | Status: DC
  Administered 2011-03-19 – 2011-03-23 (×9): 100 mg via ORAL
  Filled 2011-03-19 (×9): qty 1

## 2011-03-19 MED ORDER — FUROSEMIDE 10 MG/ML IJ SOLN
40.0000 mg | Freq: Once | INTRAMUSCULAR | Status: AC
  Administered 2011-03-19: 40 mg via INTRAVENOUS
  Filled 2011-03-19: qty 4

## 2011-03-19 MED ORDER — DILTIAZEM HCL 100 MG IV SOLR
10.0000 mg/h | Freq: Once | INTRAVENOUS | Status: DC
  Filled 2011-03-19: qty 100

## 2011-03-19 MED ORDER — DEXTROSE-NACL 5-0.45 % IV SOLN
INTRAVENOUS | Status: DC
  Administered 2011-03-19: 20 mL/h via INTRAVENOUS

## 2011-03-19 NOTE — Progress Notes (Signed)
ANTICOAGULATION CONSULT NOTE - Initial Consult  Pharmacy Consult for warfarin Indication: atrial fibrillation  Allergies  Allergen Reactions  . Shrimp (Shellfish Allergy) Swelling    Patient Measurements: Height: 5\' 3"  (160 cm) Weight: 151 lb (68.493 kg) IBW/kg (Calculated) : 52.4  Heparin Dosing Weight: 68.5 kg  Vital Signs: Temp: 97.5 F (36.4 C) (02/11 1724) Temp src: Oral (02/11 1724) BP: 168/108 mmHg (02/11 1821) Pulse Rate: 98  (02/11 1821)  Labs:  Basename 03/19/11 1240  HGB 13.2  HCT 41.5  PLT 280  APTT 46*  LABPROT 29.7*  INR 2.77*  HEPARINUNFRC --  CREATININE 1.01  CKTOTAL --  CKMB --  TROPONINI <0.30   Estimated Creatinine Clearance: 34.4 ml/min (by C-G formula based on Cr of 1.01).  Medical History: Past Medical History  Diagnosis Date  . Hypertension   . A-fib   . Arthritis     Medications:  Prescriptions prior to admission  Medication Sig Dispense Refill  . Calcium Carbonate (CALTRATE 600) 1500 MG TABS Take 1,500 mg by mouth once.      . diltiazem (CARDIZEM CD) 360 MG 24 hr capsule Take 360 mg by mouth daily. For high BP      . HYDROcodone-acetaminophen (LORCET) 10-650 MG per tablet Take 0.5 tablets by mouth 4 (four) times daily as needed. For pain      . lisinopril (PRINIVIL,ZESTRIL) 40 MG tablet Take 40 mg by mouth daily.      . metoprolol tartrate (LOPRESSOR) 25 MG tablet Take 12.5 mg by mouth 2 (two) times daily.      Marland Kitchen warfarin (COUMADIN) 2.5 MG tablet Take 2.5 mg by mouth See admin instructions. Patient takes 1 tablet all days of the week except NONE on Wednesdays        Assessment: 78 yo female with on chronic Coumadin for afib with therapeutic INR today.  Last dose taken 03/18/11.  Admitted with progressive weakness, afib with RVR.  To continue Coumadin per pharmacy in hospital.  No bleeding noted, CBC WNL.  Goal of Therapy:  INR 2-3   Plan:  1. Continue Coumadin at home dose of 2.5 mg daily except none on Wednesdays. 2. Daily  INR. 3. Monitor CBC.  Gardner Candle 03/19/2011,8:02 PM

## 2011-03-19 NOTE — ED Notes (Signed)
Per EMS, went to [facility] a week ago today for check up and pt presents for SOB and weakness only when standing. Pt AFIB on monitor. Denies any chest pain only c/o SOB.

## 2011-03-19 NOTE — ED Notes (Signed)
[id number] Ready 

## 2011-03-19 NOTE — ED Provider Notes (Signed)
History     CSN: [id number]  Arrival date & time 03/19/11  1158   First MD Initiated Contact with Patient 03/19/11 1214      Chief Complaint  Patient presents with  . Shortness of Breath    (Consider location/radiation/quality/duration/timing/severity/associated sxs/prior treatment) The history is provided by the patient.   78 year old female presents with weakness for the last week. She states that she is normally able to ambulate without a walker, but has had to use a walker for the last week. Weakness is severe and is getting worse. Denies chest pain, heaviness, tightness, or pressure. She denies dyspnea. She is aware of palpitations but is for a vague on how long she has been aware of them. She denies nausea, vomiting, diarrhea. She denies fever, chills, sweats. She does have a history of atrial fibrillation. She has not done anything to try and help her symptoms. Nothing makes her feel better and nothing makes her feel worse.  Past Medical History  Diagnosis Date  . Hypertension   . A-fib   . Arthritis     Past Surgical History  Procedure Date  . Other surgical history     bilateral hip replacement  . Joint replacement     bilateral hip replacement    History reviewed. No pertinent family history.  History  Substance Use Topics  . Smoking status: Never Smoker   . Smokeless tobacco: Not on file  . Alcohol Use: No    OB History    Grav Para Term Preterm Abortions TAB SAB Ect Mult Living                  Review of Systems  All other systems reviewed and are negative.    Allergies  Shrimp  Home Medications  No current outpatient prescriptions on file.  BP 151/125  Pulse 98  Temp(Src) 98 F (36.7 C) (Oral)  Resp 30  Ht 5\' 3"  (1.6 m)  Wt 151 lb (68.493 kg)  BMI 26.75 kg/m2  SpO2 98%  Physical Exam  Nursing note and vitals reviewed.  78 year old female who is resting comfortably and in no acute distress. Vital signs are significant for hypertension  with blood pressure 151/125. Heart ordered as 98, but cardiac monitor shows atrial fibrillation with a rate of approximately 140. She is noted to be moderately tachypneic with respiratory rate of 30. Oxygen saturation is 98% which is normal. Head is normocephalic and atraumatic. PERRLA, EOMI. Oropharynx is clear. Neck is nontender and without adenopathy or JVD. Back is nontender. Lungs are clear without rales, wheezes, or rhonchi. Heart is tachycardic and irregular without murmur. Abdomen is soft, flat, nontender without masses or hepatosplenomegaly. Extremities have no cyanosis or edema, full range of motion is present. Skin is warm and dry without rash. Neurologic: Mental status is normal, cranial nerves are intact, there no focal motor or sensory deficits.  ED Course  Procedures (including critical care time)  Results for orders placed during the hospital encounter of 03/19/11  CBC      Component Value Range   WBC 13.3 (*) 4.0 - 10.5 (K/uL)   RBC 4.57  3.87 - 5.11 (MIL/uL)   Hemoglobin 13.2  12.0 - 15.0 (g/dL)   HCT 14.7  82.9 - 56.2 (%)   MCV 90.8  78.0 - 100.0 (fL)   MCH 28.9  26.0 - 34.0 (pg)   MCHC 31.8  30.0 - 36.0 (g/dL)   RDW 13.0  86.5 - 78.4 (%)   Platelets 280  150 - 400 (K/uL)  DIFFERENTIAL      Component Value Range   Neutrophils Relative 82 (*) 43 - 77 (%)   Neutro Abs 10.9 (*) 1.7 - 7.7 (K/uL)   Lymphocytes Relative 12  12 - 46 (%)   Lymphs Abs 1.6  0.7 - 4.0 (K/uL)   Monocytes Relative 6  3 - 12 (%)   Monocytes Absolute 0.8  0.1 - 1.0 (K/uL)   Eosinophils Relative 0  0 - 5 (%)   Eosinophils Absolute 0.0  0.0 - 0.7 (K/uL)   Basophils Relative 0  0 - 1 (%)   Basophils Absolute 0.0  0.0 - 0.1 (K/uL)  BASIC METABOLIC PANEL      Component Value Range   Sodium 146 (*) 135 - 145 (mEq/L)   Potassium 3.6  3.5 - 5.1 (mEq/L)   Chloride 104  96 - 112 (mEq/L)   CO2 29  19 - 32 (mEq/L)   Glucose, Bld 117 (*) 70 - 99 (mg/dL)   BUN 28 (*) 6 - 23 (mg/dL)   Creatinine, Ser 4.54   0.50 - 1.10 (mg/dL)   Calcium 09.8  8.4 - 10.5 (mg/dL)   GFR calc non Af Amer 48 (*) >90 (mL/min)   GFR calc Af Amer 55 (*) >90 (mL/min)  TROPONIN I      Component Value Range   Troponin I <0.30  <0.30 (ng/mL)  PROTIME-INR      Component Value Range   Prothrombin Time 29.7 (*) 11.6 - 15.2 (seconds)   INR 2.77 (*) 0.00 - 1.49   APTT      Component Value Range   aPTT 46 (*) 24 - 37 (seconds)   Dg Chest Portable 1 View  03/19/2011  *RADIOLOGY REPORT*  Clinical Data: Shortness of breath.  PORTABLE CHEST - 1 VIEW  Comparison: 04/24/2007  Findings: There is chronic cardiomegaly.  Pulmonary vascularity is normal.  Slight haziness at the left lung base may represent slight atelectasis.  No acute osseous abnormalities.  IMPRESSION: Chronic cardiomegaly.  Probable slight atelectasis at the left base.  Original Report Authenticated By: Gwynn Burly, M.D.      Date: 03/19/2011  Rate: 144  Rhythm: atrial fibrillation  QRS Axis: normal  Intervals: normal  ST/T Wave abnormalities: nonspecific ST/T changes  Conduction Disutrbances:none  Narrative Interpretation: Sure fibrillation with rapid ventricular response. Nonspecific ST-T wave changes which may be rate related. When compared with ECG of 02/09/2005, no significant changes are seen, although rate is faster by 22 beats per minute.  Old EKG Reviewed: unchanged  She was initially placed on a Cardizem drip which did not give sufficient control of heart rate. She was given IV metoprolol with better control of heart rate. Cardiology consultation will be obtained for possible admission.  1. Atrial fibrillation with rapid ventricular response   2. Weakness     CRITICAL CARE Performed by: [email or web address]   Total critical care time: 70 minutes  Critical care time was exclusive of separately billable procedures and treating other patients.  Critical care was necessary to treat or prevent imminent or life-threatening deterioration.  Critical  care was time spent personally by me on the following activities: development of treatment plan with patient and/or surrogate as well as nursing, discussions with consultants, evaluation of patient's response to treatment, examination of patient, obtaining history from patient or surrogate, ordering and performing treatments and interventions, ordering and review of laboratory studies, ordering and review of radiographic studies, pulse oximetry and re-evaluation of patient's  condition.   MDM  Atrial fibrillation with rapid ventricular response. It is uncertain how long it has been present, but more than likely it has been present for the entire week that the patient has been feeling weak. She will be treated with intravenous diltiazem to try and control her rate and cardiology consultation will be obtained. ABCD2 scoe is 2 which puts her one year stroke risk at 4%.        Dione Booze, MD 03/19/11 1728

## 2011-03-19 NOTE — ED Notes (Signed)
Patient's sitter at bedside.

## 2011-03-19 NOTE — ED Notes (Signed)
sts the weakness is just in her legs when she stands up last week.

## 2011-03-19 NOTE — Progress Notes (Signed)
CRITICAL VALUE ALERT  Critical value received:  CKMB 9.8  Date of notification:  03/19/11  Time of notification:  2225  Critical value read back:yes  Nurse who received alert:  Rowe Pavy RN  MD notified (1st page):  Craige Cotta  Time of first page:  2240  MD notified (2nd page):  Time of second page:  Responding MD:  Craige Cotta  Time MD responded:  2248

## 2011-03-19 NOTE — H&P (Signed)
PCP:   Ailene Ravel, MD, MD   Chief Complaint:  Dyspnea  HPI: 78 year old woman, with history of recent hip replacement, presented to the emergency room today with 48 hours of worsening dyspnea, orthopnea, inability to lie flat and audible wheeze. Patient does report remote history of asthma as a young woman. She denies any acute illnesses.  Review of Systems:  The patient denies anorexia, fever, weight loss,, vision loss, decreased hearing, hoarseness, chest pain, syncope, balance deficits, hemoptysis, abdominal pain, melena, hematochezia, severe indigestion/heartburn, hematuria, incontinence, genital sores, muscle weakness, suspicious skin lesions, transient blindness, difficulty walking, depression,    Past Medical History: Past Medical History  Diagnosis Date  . Hypertension   . A-fib   . Arthritis    Past Surgical History  Procedure Date  . Other surgical history     bilateral hip replacement  . Joint replacement     bilateral hip replacement    Medications: Prior to Admission medications   Medication Sig Start Date End Date Taking? Authorizing Provider  Calcium Carbonate (CALTRATE 600) 1500 MG TABS Take 1,500 mg by mouth once.   Yes Historical Provider, MD  diltiazem (CARDIZEM CD) 360 MG 24 hr capsule Take 360 mg by mouth daily. For high BP   Yes Historical Provider, MD  HYDROcodone-acetaminophen (LORCET) 10-650 MG per tablet Take 0.5 tablets by mouth 4 (four) times daily as needed. For pain   Yes Historical Provider, MD  lisinopril (PRINIVIL,ZESTRIL) 40 MG tablet Take 40 mg by mouth daily.   Yes Historical Provider, MD  metoprolol tartrate (LOPRESSOR) 25 MG tablet Take 12.5 mg by mouth 2 (two) times daily.   Yes Historical Provider, MD  warfarin (COUMADIN) 2.5 MG tablet Take 2.5 mg by mouth See admin instructions. Patient takes 1 tablet all days of the week except NONE on Wednesdays   Yes Historical Provider, MD    Allergies:   Allergies  Allergen Reactions  .  Shrimp (Shellfish Allergy) Swelling    Social History:  reports that she has never smoked. She does not have any smokeless tobacco history on file. She reports that she does not drink alcohol or use illicit drugs.  History   Social History Narrative  . No narrative on file     Family History: History reviewed. No pertinent family history.  Physical Exam: Filed Vitals:   03/19/11 1532 03/19/11 1546 03/19/11 1724 03/19/11 1821  BP: 188/169 170/116 155/65 168/108  Pulse: 107 108 91 98  Temp:   97.5 F (36.4 C)   TempSrc:   Oral   Resp: 24 26 27 20   Height:      Weight:      SpO2: 92% 95% 95% 92%   Alert and oriented x3 Head normocephalic atraumatic Eyes pupils equal round react to light and accommodation Throat clear Neck with bilateral JVD Chest bilateral wheezes Heart irregularly irregular without murmurs Abdomen soft nontender bowel sounds present Lower extremities with +1 bilaterally edema Skin pale dry no suspicious rashes  Labs on Admission:   [facility] 03/19/11 1240  NA 146*  K 3.6  CL 104  CO2 29  GLUCOSE 117*  BUN 28*  CREATININE 1.01  CALCIUM 10.2  MG --  PHOS --    Basename 03/19/11 1240  WBC 13.3*  NEUTROABS 10.9*  HGB 13.2  HCT 41.5  MCV 90.8  PLT 280    Basename 03/19/11 1240  CKTOTAL --  CKMB --  CKMBINDEX --  TROPONINI <0.30   EKG  afib without ST depression  Radiological Exams on Admission: Dg Chest Portable 1 View  03/19/2011  *RADIOLOGY REPORT*  Clinical Data: Shortness of breath.  PORTABLE CHEST - 1 VIEW  Comparison: 04/24/2007  Findings: There is chronic cardiomegaly.  Pulmonary vascularity is normal.  Slight haziness at the left lung base may represent slight atelectasis.  No acute osseous abnormalities.  IMPRESSION: Chronic cardiomegaly.  Probable slight atelectasis at the left base.  Original Report Authenticated By: Gwynn Burly, M.D.    Assessment/Plan Present on Admission:   .Hypertension .A-fib .Arthritis .CHF (congestive heart failure) .Asthma   78 year old woman admitted with clinical syndrome of congestive heart failure, probably precipitated by atrial fibrillation with rapid ventricular response as well as surge in the blood pressure. We do not have a documented echocardiogram on file.  Plan is to admit the patient to telemetry, cycle cardiac enzymes, check pro BNP, check TSH, obtain echocardiogram and consult [facility] for further recommendations The patient will be continued on intravenous Cardizem and oral metoprolol and she will receive intravenous furosemide as well. I suspect the bronchospasm is due to cardiac asthma but due to the severity of symptoms we'll start also bronchodilator with Spiriva  [name] 03/19/2011, 9:29 PM

## 2011-03-20 DIAGNOSIS — E876 Hypokalemia: Secondary | ICD-10-CM | POA: Diagnosis not present

## 2011-03-20 LAB — TSH: TSH: 1.313 u[IU]/mL (ref 0.350–4.500)

## 2011-03-20 LAB — CARDIAC PANEL(CRET KIN+CKTOT+MB+TROPI)
Relative Index: 2.6 — ABNORMAL HIGH (ref 0.0–2.5)
Relative Index: 2.4 (ref 0.0–2.5)
Total CK: 285 U/L — ABNORMAL HIGH (ref 7–177)
Troponin I: <0.3 ng/mL (ref <0.30)

## 2011-03-20 LAB — BASIC METABOLIC PANEL
BUN: 25 mg/dL — ABNORMAL HIGH (ref 6–23)
CO2: 33 mEq/L — ABNORMAL HIGH (ref 19–32)
Calcium: 9.7 mg/dL (ref 8.4–10.5)
Chloride: 101 mEq/L (ref 96–112)
GFR calc non Af Amer: 42 mL/min — ABNORMAL LOW (ref >90)
Glucose, Bld: 106 mg/dL — ABNORMAL HIGH (ref 70–99)
Potassium: 3.8 mEq/L (ref 3.5–5.1)
Sodium: 145 mEq/L (ref 135–145)

## 2011-03-20 MED ORDER — DILTIAZEM HCL 30 MG PO TABS
30.0000 mg | ORAL_TABLET | Freq: Four times a day (QID) | ORAL | Status: DC
  Administered 2011-03-20 – 2011-03-21 (×6): 30 mg via ORAL
  Filled 2011-03-20 (×9): qty 1

## 2011-03-20 MED ORDER — POTASSIUM CHLORIDE 10 MEQ/100ML IV SOLN
10.0000 meq | INTRAVENOUS | Status: AC
  Administered 2011-03-20 (×2): 10 meq via INTRAVENOUS
  Filled 2011-03-20 (×2): qty 100

## 2011-03-20 MED ORDER — POTASSIUM CHLORIDE CRYS ER 20 MEQ PO TBCR
40.0000 meq | EXTENDED_RELEASE_TABLET | Freq: Once | ORAL | Status: AC
  Administered 2011-03-20: 40 meq via ORAL
  Filled 2011-03-20 (×2): qty 2

## 2011-03-20 MED ORDER — WARFARIN SODIUM 2.5 MG PO TABS
2.5000 mg | ORAL_TABLET | ORAL | Status: DC
  Administered 2011-03-20: 2.5 mg via ORAL
  Filled 2011-03-20: qty 1

## 2011-03-20 NOTE — Evaluation (Signed)
Physical Therapy Evaluation Patient Details Name: [name] MRN: [id number] DOB: 22-Sep-1918 Today's Date: 03/20/2011  Problem List:  Patient Active Problem List  Diagnoses  . Hypertension  . A-fib, persistant  . Arthritis  . CHF (congestive heart failure), secondary to rapid AF  . Asthma  . Hypokalemia    Past Medical History:  Past Medical History  Diagnosis Date  . Hypertension   . A-fib   . Arthritis    Past Surgical History:  Past Surgical History  Procedure Date  . Other surgical history     bilateral hip replacement  . Joint replacement     bilateral hip replacement    PT Assessment/Plan/Recommendation PT Assessment Clinical Impression Statement: pt is a 78 y/o female adm with SOB due to afib induced CHF.  Presently, diuretics have brought the volume overload more under control and pt is feeling better.  Pt can benefit from HHPT to address issues of weakness and deconditioning and can help get the pt back onto the cane if she desires. PT Recommendation/Assessment: Patient will need skilled PT in the acute care venue PT Problem List: Decreased strength;Decreased activity tolerance;Decreased balance;Decreased mobility;Decreased knowledge of use of DME;Cardiopulmonary status limiting activity PT Therapy Diagnosis : Generalized weakness PT Plan PT Frequency: Min 3X/week PT Treatment/Interventions: DME instruction;Gait training;Stair training;Therapeutic activities;Balance training;Patient/family education PT Recommendation Follow Up Recommendations: Home health PT Equipment Recommended: None recommended by PT PT Goals  Acute Rehab PT Goals PT Goal Formulation: With patient Time For Goal Achievement: 7 days Pt will go Supine/Side to Sit: with modified independence PT Goal: Supine/Side to Sit - Progress: Progressing toward goal Pt will go Sit to Stand: with modified independence PT Goal: Sit to Stand - Progress: Progressing toward goal Pt will Transfer Bed to  Chair/Chair to Bed: with modified independence PT Transfer Goal: Bed to Chair/Chair to Bed - Progress: Goal set today Pt will Ambulate: 51 - 150 feet;with modified independence;with least restrictive assistive device PT Goal: Ambulate - Progress: Goal set today  PT Evaluation Precautions/Restrictions  Precautions Precautions: Fall Prior Functioning  Home Living Lives With: Alone Receives Help From: Personal care attendant;Other (Comment) (3 hrs/day 3 days/wk) Type of Home: House Home Layout: Two level;Able to live on main level with bedroom/bathroom Alternate Level Stairs-Rails: Can reach both Alternate Level Stairs-Number of Steps: 14 Home Access: Ramped entrance Bathroom Shower/Tub: Health visitor: Handicapped height Bathroom Accessibility: Yes Home Adaptive Equipment: Quad cane;Walker - rolling;Shower chair with back Prior Function Level of Independence: Independent with basic ADLs;Independent with gait;Independent with transfers;Requires assistive device for independence Cognition Cognition Arousal/Alertness: Awake/alert Overall Cognitive Status: Appears within functional limits for tasks assessed Sensation/Coordination Coordination Gross Motor Movements are Fluid and Coordinated: Yes Fine Motor Movements are Fluid and Coordinated: Not tested Extremity Assessment RUE Assessment RUE Assessment: Within Functional Limits LUE Assessment LUE Assessment: Within Functional Limits RLE Assessment RLE Assessment: Within Functional Limits LLE Assessment LLE Assessment: Within Functional Limits (general weakness at ~4/5 bil) Mobility (including Balance) Bed Mobility Bed Mobility: Yes Supine to Sit: 6: Modified independent (Device/Increase time);HOB elevated (Comment degrees);Other (comment) (30 degrees) Sitting - Scoot to Edge of Bed: 6: Modified independent (Device/Increase time) Transfers Transfers: Yes Sit to Stand: 5: Supervision Sit to Stand Details  (indicate cue type and reason): reinforced hand placement Stand to Sit: 5: Supervision Ambulation/Gait Ambulation/Gait: Yes Ambulation/Gait Assistance: Other (comment) (min gaurd A) Ambulation Distance (Feet): 60 Feet Assistive device: Rolling walker Gait Pattern: Step-through pattern (gerally steady with less than optimal use of RW)  Posture/Postural Control Posture/Postural Control: No significant limitations Balance Balance Assessed: No (generally steady on the RW) Exercise    End of Session PT - End of Session Activity Tolerance: Patient tolerated treatment well Patient left: in chair;with call bell in reach;with family/visitor present Nurse Communication: Mobility status for transfers;Mobility status for ambulation General Behavior During Session: [employer] for tasks performed Cognition: [facility] for tasks performed  Nic Lampe, Eliseo Gum 03/20/2011, 12:55 PM  03/20/2011  [place], PT [phone] (pager)

## 2011-03-20 NOTE — Consults (Signed)
Pt. Seen and examined. Agree with the NP/PA-C note as written.  Chronic atrial fibrillation, presents with probable diastolic CHF exacerbation, a-fib with RVR .Marland Kitchen Rate now controlled, improved on diuretics. Will review 2D echo today. Continue coumadin. She has improved nicely. Possible d/c home tomorrow.  Can follow-up with me in [place] or [place].  Thanks for the consult.  Chrystie Nose, MD, [facility] Attending Cardiologist [facility]

## 2011-03-20 NOTE — Progress Notes (Signed)
Utilization review complete 

## 2011-03-20 NOTE — Consults (Signed)
Reason for Consult: Dyspnea  Requesting Physician: [facility]  HPI: This is a 78 y.o. female with a past medical history significant for chronic atrial fibrillation. She sees Dr. Burnell Blanks. She initially saw Dr. Elsie Lincoln in 2006 when she had hip surgery. An echocardiogram and Myoview in 2006 were essentially normal. She has no history of coronary disease or MI. Dr. Nathanial Rancher follows her Coumadin. She did see Dr. Rennis Golden December 8th 2011 for routine visit.  She has been living at home alone. She is getting ready to move to an assisted living facility. She was admitted 03/19/2011 with dyspnea and rapid atrial fibrillation. She is improved after diuresis and rate control. We were asked to see her in consult for further evaluation and recommendations.   PMHx:  Past Medical History  Diagnosis Date  . Hypertension   . A-fib   . Arthritis    Past Surgical History  Procedure Date  . Other surgical history     bilateral hip replacement  . Joint replacement     bilateral hip replacement    FAMHx: History reviewed. No pertinent family history.  SOCHx:  reports that she has never smoked. She does not have any smokeless tobacco history on file. She reports that she does not drink alcohol or use illicit drugs.  ALLERGIES: Allergies  Allergen Reactions  . Shrimp (Shellfish Allergy) Swelling    ROS: Pertinent items are noted in HPI. no chest pain  HOME MEDICATIONS: Prescriptions prior to admission  Medication Sig Dispense Refill  . Calcium Carbonate (CALTRATE 600) 1500 MG TABS Take 1,500 mg by mouth once.      . diltiazem (CARDIZEM CD) 360 MG 24 hr capsule Take 360 mg by mouth daily. For high BP      . HYDROcodone-acetaminophen (LORCET) 10-650 MG per tablet Take 0.5 tablets by mouth 4 (four) times daily as needed. For pain      . lisinopril (PRINIVIL,ZESTRIL) 40 MG tablet Take 40 mg by mouth daily.      . metoprolol tartrate (LOPRESSOR) 25 MG tablet Take 12.5 mg by mouth 2 (two)  times daily.      Marland Kitchen warfarin (COUMADIN) 2.5 MG tablet Take 2.5 mg by mouth See admin instructions. Patient takes 1 tablet all days of the week except NONE on Wednesdays        HOSPITAL MEDICATIONS: I have reviewed the patient's current medications.  VITALS: Blood pressure 148/98, pulse 101, temperature 97.5 F (36.4 C), temperature source Oral, resp. rate 18, height 5\' 3"  (1.6 m), weight 68.4 kg (150 lb 12.7 oz), SpO2 95.00%.  PHYSICAL EXAM: General appearance: alert, cooperative and no distress Neck: no adenopathy, no carotid bruit, supple, symmetrical, trachea midline, thyroid not enlarged, symmetric, no tenderness/mass/nodules and soft carotid bruit on Rt Lungs: decreased breath sounds bilat Heart: regular rate and rhythm and 2/6 systolic murmur LSB Abdomen: soft, non-tender; bowel sounds normal; no masses,  no organomegaly Extremities: no edema Pulses: 2+ and symmetric Skin: Skin color, texture, turgor normal. No rashes or lesions Neurologic: Grossly normal  LABS: Results for orders placed during the hospital encounter of 03/19/11 (from the past 48 hour(s))  CBC     Status: Abnormal   Collection Time   03/19/11 12:40 PM      Component Value Range Comment   WBC 13.3 (*) 4.0 - 10.5 (K/uL)    RBC 4.57  3.87 - 5.11 (MIL/uL)    Hemoglobin 13.2  12.0 - 15.0 (g/dL)    HCT 16.1  09.6 - 04.5 (%)  MCV 90.8  78.0 - 100.0 (fL)    MCH 28.9  26.0 - 34.0 (pg)    MCHC 31.8  30.0 - 36.0 (g/dL)    RDW 63.8  75.6 - 43.3 (%)    Platelets 280  150 - 400 (K/uL)   DIFFERENTIAL     Status: Abnormal   Collection Time   03/19/11 12:40 PM      Component Value Range Comment   Neutrophils Relative 82 (*) 43 - 77 (%)    Neutro Abs 10.9 (*) 1.7 - 7.7 (K/uL)    Lymphocytes Relative 12  12 - 46 (%)    Lymphs Abs 1.6  0.7 - 4.0 (K/uL)    Monocytes Relative 6  3 - 12 (%)    Monocytes Absolute 0.8  0.1 - 1.0 (K/uL)    Eosinophils Relative 0  0 - 5 (%)    Eosinophils Absolute 0.0  0.0 - 0.7 (K/uL)     Basophils Relative 0  0 - 1 (%)    Basophils Absolute 0.0  0.0 - 0.1 (K/uL)   BASIC METABOLIC PANEL     Status: Abnormal   Collection Time   03/19/11 12:40 PM      Component Value Range Comment   Sodium 146 (*) 135 - 145 (mEq/L)    Potassium 3.6  3.5 - 5.1 (mEq/L)    Chloride 104  96 - 112 (mEq/L)    CO2 29  19 - 32 (mEq/L)    Glucose, Bld 117 (*) 70 - 99 (mg/dL)    BUN 28 (*) 6 - 23 (mg/dL)    Creatinine, Ser 2.95  0.50 - 1.10 (mg/dL)    Calcium 18.8  8.4 - 10.5 (mg/dL)    GFR calc non Af Amer 48 (*) >90 (mL/min)    GFR calc Af Amer 55 (*) >90 (mL/min)   TROPONIN I     Status: Normal   Collection Time   03/19/11 12:40 PM      Component Value Range Comment   Troponin I <0.30  <0.30 (ng/mL)   PROTIME-INR     Status: Abnormal   Collection Time   03/19/11 12:40 PM      Component Value Range Comment   Prothrombin Time 29.7 (*) 11.6 - 15.2 (seconds)    INR 2.77 (*) 0.00 - 1.49    APTT     Status: Abnormal   Collection Time   03/19/11 12:40 PM      Component Value Range Comment   aPTT 46 (*) 24 - 37 (seconds)   PRO B NATRIURETIC PEPTIDE     Status: Abnormal   Collection Time   03/19/11  9:02 PM      Component Value Range Comment   Pro B Natriuretic peptide (BNP) 4199.0 (*) 0 - 450 (pg/mL)   CARDIAC PANEL(CRET KIN+CKTOT+MB+TROPI)     Status: Abnormal   Collection Time   03/19/11  9:15 PM      Component Value Range Comment   Total CK 343 (*) 7 - 177 (U/L)    CK, MB 9.8 (*) 0.3 - 4.0 (ng/mL)    Troponin I <0.30  <0.30 (ng/mL)    Relative Index 2.9 (*) 0.0 - 2.5    TSH     Status: Normal   Collection Time   03/19/11  9:52 PM      Component Value Range Comment   TSH 1.313  0.350 - 4.500 (uIU/mL)   CARDIAC PANEL(CRET KIN+CKTOT+MB+TROPI)     Status: Abnormal  Collection Time   03/20/11  3:42 AM      Component Value Range Comment   Total CK 289 (*) 7 - 177 (U/L)    CK, MB 7.5 (*) 0.3 - 4.0 (ng/mL) CRITICAL VALUE NOTED.  VALUE IS CONSISTENT WITH PREVIOUSLY REPORTED AND CALLED VALUE.     Troponin I <0.30  <0.30 (ng/mL)    Relative Index 2.6 (*) 0.0 - 2.5    BASIC METABOLIC PANEL     Status: Abnormal   Collection Time   03/20/11  3:43 AM      Component Value Range Comment   Sodium 145  135 - 145 (mEq/L)    Potassium 2.9 (*) 3.5 - 5.1 (mEq/L) DELTA CHECK NOTED   Chloride 101  96 - 112 (mEq/L)    CO2 33 (*) 19 - 32 (mEq/L)    Glucose, Bld 110 (*) 70 - 99 (mg/dL)    BUN 22  6 - 23 (mg/dL)    Creatinine, Ser 1.47  0.50 - 1.10 (mg/dL)    Calcium 82.9  8.4 - 10.5 (mg/dL)    GFR calc non Af Amer 49 (*) >90 (mL/min)    GFR calc Af Amer 56 (*) >90 (mL/min)   CARDIAC PANEL(CRET KIN+CKTOT+MB+TROPI)     Status: Abnormal   Collection Time   03/20/11 11:05 AM      Component Value Range Comment   Total CK 285 (*) 7 - 177 (U/L) HEMOLYSIS AT THIS LEVEL MAY AFFECT RESULT   CK, MB 6.7 (*) 0.3 - 4.0 (ng/mL) CRITICAL VALUE NOTED.  VALUE IS CONSISTENT WITH PREVIOUSLY REPORTED AND CALLED VALUE.   Troponin I <0.30  <0.30 (ng/mL)    Relative Index 2.4  0.0 - 2.5    PROTIME-INR     Status: Abnormal   Collection Time   03/20/11 11:05 AM      Component Value Range Comment   Prothrombin Time 28.4 (*) 11.6 - 15.2 (seconds)    INR 2.62 (*) 0.00 - 1.49      IMAGING: Dg Chest Portable 1 View  03/19/2011  *RADIOLOGY REPORT*  Clinical Data: Shortness of breath.  PORTABLE CHEST - 1 VIEW  Comparison: 04/24/2007  Findings: There is chronic cardiomegaly.  Pulmonary vascularity is normal.  Slight haziness at the left lung base may represent slight atelectasis.  No acute osseous abnormalities.  IMPRESSION: Chronic cardiomegaly.  Probable slight atelectasis at the left base.  Original Report Authenticated By: Gwynn Burly, M.D.    IMPRESSION:  Principal Problem:  *A-fib, persistant, rapid VR on adm  Active Problems:  CHF (congestive heart failure), secondary to rapid AF  Hypertension  Arthritis  Asthma  Hypokalemia  Chronic Coumadin Rx   RECOMMENDATION: Dr Rennis Golden to see.  Time Spent  Directly with Patient: 30 minutes  Andria Head K 03/20/2011, 12:09 PM

## 2011-03-20 NOTE — Progress Notes (Signed)
ANTICOAGULATION CONSULT NOTE - Initial Consult  Pharmacy Consult for warfarin Indication: atrial fibrillation  Allergies  Allergen Reactions  . Shrimp (Shellfish Allergy) Swelling    Patient Measurements: Height: 5\' 3"  (160 cm) Weight: 150 lb 12.7 oz (68.4 kg) IBW/kg (Calculated) : 52.4  Heparin Dosing Weight: 68.5 kg  Vital Signs: Temp: 97.5 F (36.4 C) (02/12 0500) Temp src: Oral (02/12 0500) BP: 148/98 mmHg (02/12 0500) Pulse Rate: 101  (02/12 0500)  Labs:  Basename 03/20/11 1105 03/20/11 0343 03/20/11 0342 03/19/11 2115 03/19/11 1240  HGB -- -- -- -- 13.2  HCT -- -- -- -- 41.5  PLT -- -- -- -- 280  APTT -- -- -- -- 46*  LABPROT 28.4* -- -- -- 29.7*  INR 2.62* -- -- -- 2.77*  HEPARINUNFRC -- -- -- -- --  CREATININE -- 0.99 -- -- 1.01  CKTOTAL 285* -- 289* 343* --  CKMB 6.7* -- 7.5* 9.8* --  TROPONINI <0.30 -- <0.30 <0.30 --   Estimated Creatinine Clearance: 35.1 ml/min (by C-G formula based on Cr of 0.99).  Medical History: Past Medical History  Diagnosis Date  . Hypertension   . A-fib   . Arthritis     Medications:  Prescriptions prior to admission  Medication Sig Dispense Refill  . Calcium Carbonate (CALTRATE 600) 1500 MG TABS Take 1,500 mg by mouth once.      . diltiazem (CARDIZEM CD) 360 MG 24 hr capsule Take 360 mg by mouth daily. For high BP      . HYDROcodone-acetaminophen (LORCET) 10-650 MG per tablet Take 0.5 tablets by mouth 4 (four) times daily as needed. For pain      . lisinopril (PRINIVIL,ZESTRIL) 40 MG tablet Take 40 mg by mouth daily.      . metoprolol tartrate (LOPRESSOR) 25 MG tablet Take 12.5 mg by mouth 2 (two) times daily.      Marland Kitchen warfarin (COUMADIN) 2.5 MG tablet Take 2.5 mg by mouth See admin instructions. Patient takes 1 tablet all days of the week except NONE on Wednesdays        Assessment: 78 yo female with on chronic Coumadin for afib with therapeutic INR today.  Admitted with progressive weakness, afib with RVR. INR 2.62 today  still in goal.   Lytes: Hypokalemia K = 2.9. Some replacement ordered. Recheck this afternoon.  HTN/CHF. Max BP 188/169 with HR 91-108 yesterday. Meds: ASA81mg , Diltiazem, IV lasix, metoprolol  Resp: Xopenex prn, Spiriva  Goal of Therapy:  INR 2-3   Plan:  1. Continue Coumadin at home dose of 2.5 mg daily except none on Wednesdays. 2. Daily PT/INR  Misty Stanley Stillinger 03/20/2011,12:29 PM

## 2011-03-20 NOTE — Progress Notes (Signed)
   CARE MANAGEMENT NOTE 03/20/2011  Patient:  [name], [name]   Account Number:  [id number]  Date Initiated:  03/20/2011  Documentation initiated by:  Donn Pierini  Subjective/Objective Assessment:   Pt admitted  with afib- cards following     Action/Plan:   PTA pt lived at home alone, was independent with ADLs, PT eval   Anticipated DC Date:  03/21/2011   Anticipated DC Plan:  HOME W HOME HEALTH SERVICES      DC Planning Services  CM consult      [facility] Choice  HOME HEALTH   Choice offered to / List presented to:  C-1 Patient           Status of service:  In process, will continue to follow Medicare Important Message given?   (If response is "NO", the following Medicare IM given date fields will be blank) Date Medicare IM given:   Date Additional Medicare IM given:    Discharge Disposition:    Per UR Regulation:    Comments:  PCP- [place]  03/20/11- 1630- Donn Pierini RN, BSN [phone] Spoke with pt and daughter at bedside- per conversation pt lives at home with support of caregiver 3hrs a day/ 3x week. Pt is independent and gets her medications through mail order. She sometimes uses [facility]. Discussed PT recommendations for HH-therapy. Pt is agreeable to [facility]. Has had HH in past but does not remember agency used. List of agencies for [facility] given to pt and daughter- CM to follow up prior to discharge on agency of choice. Awaiting MD orders for [facility].

## 2011-03-20 NOTE — Progress Notes (Addendum)
Subjective: Patient admitted last PM with SOB, orthopnea Feeling some what better today   Objective: Vital signs in last 24 hours: Filed Vitals:   03/19/11 1724 03/19/11 1821 03/19/11 2000 03/20/11 0500  BP: 155/65 168/108 157/88 148/98  Pulse: 91 98 97 101  Temp: 97.5 F (36.4 C)  98.1 F (36.7 C) 97.5 F (36.4 C)  TempSrc: Oral  Oral Oral  Resp: 27 20 20 18   Height:   5\' 3"  (1.6 m)   Weight:   68.4 kg (150 lb 12.7 oz)   SpO2: 95% 92% 96% 95%   Weight change:   Intake/Output Summary (Last 24 hours) at 03/20/11 0916 Last data filed at 03/20/11 0700  Gross per 24 hour  Intake    300 ml  Output      0 ml  Net    300 ml    Physical Exam: General: Awake, Oriented, No acute distress. HEENT: EOMI. Neck: Supple CV: irregular Lungs: B/L wheezing Abdomen: Soft, Nontender, Nondistended, +bowel sounds. Ext: Good pulses. +edema   Lab Results:  [facility] 03/20/11 0343 03/19/11 1240  NA 145 146*  K 2.9* 3.6  CL 101 104  CO2 33* 29  GLUCOSE 110* 117*  BUN 22 28*  CREATININE 0.99 1.01  CALCIUM 10.1 10.2  MG -- --  PHOS -- --      Basename 03/19/11 1240  WBC 13.3*  NEUTROABS 10.9*  HGB 13.2  HCT 41.5  MCV 90.8  PLT 280    Basename 03/20/11 0342 03/19/11 2115 03/19/11 1240  CKTOTAL 289* 343* --  CKMB 7.5* 9.8* --  CKMBINDEX -- -- --  TROPONINI <0.30 <0.30 <0.30        Basename 03/19/11 2152  TSH 1.313  T4TOTAL --  T3FREE --  THYROIDAB --     Micro Results: No results found for this or any previous visit (from the past 240 hour(s)).  Studies/Results: Dg Chest Portable 1 View  03/19/2011  *RADIOLOGY REPORT*  Clinical Data: Shortness of breath.  PORTABLE CHEST - 1 VIEW  Comparison: 04/24/2007  Findings: There is chronic cardiomegaly.  Pulmonary vascularity is normal.  Slight haziness at the left lung base may represent slight atelectasis.  No acute osseous abnormalities.  IMPRESSION: Chronic cardiomegaly.  Probable slight atelectasis at the left  base.  Original Report Authenticated By: Gwynn Burly, M.D.    Medications: I have reviewed the patient's current medications. Scheduled Meds:   . aspirin EC  81 mg Oral Daily  . calcium carbonate  1 tablet Oral Daily  . diltiazem (CARDIZEM) infusion  5-15 mg/hr Intravenous Once  . diltiazem (CARDIZEM) infusion  10 mg/hr Intravenous Once  . diltiazem  30 mg Oral Q6H  . docusate sodium  100 mg Oral BID  . furosemide  40 mg Intravenous Once  . furosemide  40 mg Intravenous Q12H  . metoprolol  5 mg Intravenous Once  . metoprolol tartrate  25 mg Oral BID  . potassium chloride  10 mEq Intravenous Q1 Hr x 2  . potassium chloride  40 mEq Oral Once  . tiotropium  18 mcg Inhalation Daily  . DISCONTD: Calcium Carbonate  1,500 mg Oral Once   Continuous Infusions:   . dextrose 5 % and 0.45% NaCl 20 mL/hr (03/19/11 2205)   PRN Meds:.acetaminophen, acetaminophen, HYDROcodone-acetaminophen, levalbuterol, ondansetron (ZOFRAN) IV, ondansetron, senna-docusate  Assessment/Plan: 1.  *A-fib- acute on chronic: consult cards, CE, IV cardiazem IV and PO, PO BB as well, ASA- warfarin 2. Hypokalmeia: repleat, secondary to lasix  3. Hypertension- improved  4. Arthritis  5.CHF (congestive heart failure)- check echo, BNP elevated, lasix IV 6.  Asthma- ? Fluid related, will see if this improves with diuresis, watch BB  PT eval pending     LOS: 1 day  Brinda Focht, DO 03/20/2011, 9:16 AM

## 2011-03-20 NOTE — Progress Notes (Signed)
  Echocardiogram 2D Echocardiogram has been performed.  Cathie Beams Deneen 03/20/2011, 9:44 AM

## 2011-03-21 DIAGNOSIS — Z7901 Long term (current) use of anticoagulants: Secondary | ICD-10-CM

## 2011-03-21 LAB — CBC
HCT: 44.8 % (ref 36.0–46.0)
MCH: 29 pg (ref 26.0–34.0)
MCV: 90.1 fL (ref 78.0–100.0)
Platelets: 258 10*3/uL (ref 150–400)
RBC: 4.97 MIL/uL (ref 3.87–5.11)

## 2011-03-21 LAB — URINALYSIS, ROUTINE W REFLEX MICROSCOPIC
Bilirubin Urine: NEGATIVE
Glucose, UA: NEGATIVE mg/dL
Hgb urine dipstick: NEGATIVE
Protein, ur: NEGATIVE mg/dL
Urobilinogen, UA: 1 mg/dL (ref 0.0–1.0)

## 2011-03-21 LAB — BASIC METABOLIC PANEL
CO2: 30 mEq/L (ref 19–32)
Calcium: 9.7 mg/dL (ref 8.4–10.5)
Glucose, Bld: 102 mg/dL — ABNORMAL HIGH (ref 70–99)
Sodium: 143 mEq/L (ref 135–145)

## 2011-03-21 LAB — URINE MICROSCOPIC-ADD ON

## 2011-03-21 MED ORDER — POTASSIUM CHLORIDE CRYS ER 20 MEQ PO TBCR
40.0000 meq | EXTENDED_RELEASE_TABLET | Freq: Every day | ORAL | Status: DC
  Administered 2011-03-22 – 2011-03-23 (×2): 40 meq via ORAL
  Filled 2011-03-21 (×2): qty 2

## 2011-03-21 MED ORDER — WARFARIN SODIUM 2 MG PO TABS
2.0000 mg | ORAL_TABLET | ORAL | Status: DC
  Administered 2011-03-22: 2 mg via ORAL
  Filled 2011-03-21 (×2): qty 1

## 2011-03-21 MED ORDER — POTASSIUM CHLORIDE CRYS ER 20 MEQ PO TBCR: 40.0000 meq | EXTENDED_RELEASE_TABLET | Freq: Two times a day (BID) | ORAL | Status: DC

## 2011-03-21 MED ORDER — POTASSIUM CHLORIDE 20 MEQ PO PACK: 40.0000 meq | PACK | Freq: Two times a day (BID) | ORAL | Status: DC

## 2011-03-21 MED ORDER — DILTIAZEM HCL ER COATED BEADS 120 MG PO CP24
120.0000 mg | ORAL_CAPSULE | Freq: Every day | ORAL | Status: DC
  Filled 2011-03-21: qty 1

## 2011-03-21 MED ORDER — FUROSEMIDE 40 MG PO TABS
40.0000 mg | ORAL_TABLET | Freq: Every day | ORAL | Status: DC
  Administered 2011-03-22 – 2011-03-23 (×2): 40 mg via ORAL
  Filled 2011-03-21 (×2): qty 1

## 2011-03-21 MED ORDER — POTASSIUM CHLORIDE CRYS ER 20 MEQ PO TBCR
40.0000 meq | EXTENDED_RELEASE_TABLET | Freq: Once | ORAL | Status: AC
  Administered 2011-03-21: 40 meq via ORAL
  Filled 2011-03-21: qty 2

## 2011-03-21 NOTE — Progress Notes (Signed)
Physical Therapy Treatment Patient Details Name: [name] MRN: [id number] DOB: 1918-04-24 Today's Date: 03/21/2011  PT Assessment/Plan  PT - Assessment/Plan Comments on Treatment Session: New cognitive deficits today with expressive aphasia and processing/problem solving deficits. I spoke with Dr. Phillips Odor who reports she is on her way to see this patient. Daughter is aware. Ed daughter about stroke signs and symptoms and the need to tell RN of any new changes. Given new cognitive deficits pt will need 24 hour supervision at home. If this is not available she will need SNF.  PT Plan: Frequency remains appropriate;Discharge plan needs to be updated PT Frequency: Min 3X/week Recommendations for Other Services: OT consult Follow Up Recommendations: Home health PT with Supervision/Assistance - 24 hour vs Skilled nursing facility (if no 24 hour assist pt may need SNF 2/2 new cognitive deficits) Equipment Recommended: None recommended by PT PT Goals  Acute Rehab PT Goals PT Goal: Sit to Stand - Progress: Progressing toward goal PT Transfer Goal: Bed to Chair/Chair to Bed - Progress: Progressing toward goal PT Goal: Ambulate - Progress: Progressing toward goal  PT Treatment Precautions/Restrictions  Precautions Precautions: Fall Restrictions Weight Bearing Restrictions: No Mobility (including Balance) Bed Mobility Bed Mobility: No (pt sitting in recliner on presentation) Transfers Sit to Stand: 5: Supervision Sit to Stand Details (indicate cue type and reason): pt independently using armrests without cueing to stand with safer technique Stand to Sit Details: mingaurdA for stand->sit for safety as pt sitting prior to safe positioning in prep to sit and still holding onto RW during descent Ambulation/Gait Ambulation/Gait Assistance Details (indicate cue type and reason): amb mingaurdA for safety with RW, shuffled gait; pt c/o weakness so turned around Ambulation Distance (Feet): 100  Feet Assistive device: Rolling walker Gait Pattern: Trunk flexed;Shuffle  Posture/Postural Control Posture/Postural Control: Postural limitations Postural Limitations: trunk flexed Exercise    End of Session PT - End of Session Equipment Utilized During Treatment: Gait belt Activity Tolerance: Patient tolerated treatment well Patient left: in chair;with call bell in reach;with family/visitor present Nurse Communication: Mobility status for transfers;Mobility status for ambulation (concerns for cognition) General Behavior During Session: [facility] for tasks performed Cognition: Impaired Cognitive Impairment: Pt with intermittent expressive aphasia needing cues to identify that pt was having difficulty with this; some inappropriate responses during session today; during strength testing pt having a lot of difficulty processing simple commands (especially difficulty with understanding and carrying out simple one step command to extend and bed knees bilaterally for strength testing), then with difficulty identifying her left from right leg (kept lifting right arm when asked to point to her left leg)  Given pt's change in mental status I evaluated her for new physical changes. No major differences in strength noted bilaterally, no facial droop and sensation appears in tact by gross assessment.   [facility] HELEN 03/21/2011, 12:40 PM

## 2011-03-21 NOTE — Progress Notes (Signed)
Abbreviated Physical Therapy Note  Pt seen for PT this morning. Full therapy note to follow. During therapy session pt with intermittent expressive aphasia as well difficulty following simple commands involving motor planning, processing, and problem solving.  Strength grossly equal bilaterally, no facial droop noted and no changes in sensation however daughter reporting this is not baseline mentation. RN made aware of my concerns and reports the doctor has been notified. MD please address prior to pt being discharged.  Ivonne Andrew PT, DPT [phone]

## 2011-03-21 NOTE — Progress Notes (Addendum)
Subjective: Patient has new onset issues with word finding, questionable expressive aphasia. She denies any pain, dyspnea, movement problems, no FD or problems swallowing. Mobility remains weak but fully intact. Family at bedside concerned about new changes.  Objective: Vital signs in last 24 hours: Temp:  [97.5 F (36.4 C)-98.4 F (36.9 C)] 97.5 F (36.4 C) (02/13 1400) Pulse Rate:  [80-99] 80  (02/13 1400) Resp:  [18-20] 18  (02/13 1400) BP: (115-156)/(71-84) 127/79 mmHg (02/13 1400) SpO2:  [91 %-97 %] 94 % (02/13 1400) Weight:  [65.3 kg (143 lb 15.4 oz)] 65.3 kg (143 lb 15.4 oz) (02/13 0500) Weight change: -3.193 kg (-7 lb 0.6 oz) Last BM Date: 03/21/11  Intake/Output from previous day: 02/12 0701 - 02/13 0700 In: 600 [P.O.:600] Out: 301 [Urine:300; Stool:1]    Physical Exam: General: Alert, awake, oriented x3, in no acute distress. Issues with verbal expression, patient aware of problem, frustrated. HEENT: No bruits, no goiter. Heart: Regular rate and rhythm, without murmurs, rubs, gallops. Lungs: Clear to auscultation bilaterally. Abdomen: Soft, nontender, nondistended, positive bowel sounds. Extremities: No clubbing cyanosis or edema with positive pedal pulses. Neuro: UE and LE strength and sensation normal, DTRs normal, gross visual exam and fields normal, memory and word finding impaired.delayed. Insight is intact.  Lab Results: Basic Metabolic Panel:  Basename 03/21/11 0607 03/20/11 1542  NA 143 141  K 3.3* 3.8  CL 100 99  CO2 30 31  GLUCOSE 102* 106*  BUN 25* 25*  CREATININE 1.15* 1.11*  CALCIUM 9.7 9.7  MG -- --  PHOS -- --   CBC:  Basename 03/21/11 0607 03/19/11 1240  WBC 10.7* 13.3*  NEUTROABS -- 10.9*  HGB 14.4 13.2  HCT 44.8 41.5  MCV 90.1 90.8  PLT 258 280   Cardiac Enzymes:  Basename 03/20/11 1105 03/20/11 0342 03/19/11 2115  CKTOTAL 285* 289* 343*  CKMB 6.7* 7.5* 9.8*  CKMBINDEX -- -- --  TROPONINI <0.30 <0.30 <0.30   BNP:  Basename  03/19/11 2102  PROBNP 4199.0*    Basename 03/19/11 2152  TSH 1.313  T4TOTAL --  FREET4 --  T3FREE --  THYROIDAB --   Anemia Panel: No results found for this basename: VITAMINB12,FOLATE,FERRITIN,TIBC,IRON,RETICCTPCT in the last 72 hours Coagulation:  Basename 03/21/11 0607 03/20/11 1105  LABPROT 26.1* 28.4*  INR 2.35* 2.62*   UUrinalysis:  Basename 03/21/11 1733  COLORURINE YELLOW  LABSPEC 1.011  PHURINE 6.5  GLUCOSEU NEGATIVE  HGBUR NEGATIVE  BILIRUBINUR NEGATIVE  KETONESUR NEGATIVE  PROTEINUR NEGATIVE  UROBILINOGEN 1.0  NITRITE NEGATIVE  LEUKOCYTESUR SMALL*   Medications: Scheduled Meds:   . aspirin EC  81 mg Oral Daily  . calcium carbonate  1 tablet Oral Daily  . diltiazem  120 mg Oral Daily  . docusate sodium  100 mg Oral BID  . furosemide  40 mg Oral Daily  . metoprolol tartrate  25 mg Oral BID  . potassium chloride  40 mEq Oral Once  . potassium chloride  40 mEq Oral Daily  . tiotropium  18 mcg Inhalation Daily  . warfarin  2 mg Oral Custom  . DISCONTD: diltiazem (CARDIZEM) infusion  10 mg/hr Intravenous Once  . DISCONTD: diltiazem  30 mg Oral Q6H  . DISCONTD: furosemide  40 mg Intravenous Q12H  . DISCONTD: potassium chloride  40 mEq Oral BID  . DISCONTD: potassium chloride  40 mEq Oral BID  . DISCONTD: warfarin  2.5 mg Oral Custom   Continuous Infusions:   . dextrose 5 % and 0.45%  NaCl 20 mL/hr (03/19/11 2205)   PRN Meds:.acetaminophen, acetaminophen, HYDROcodone-acetaminophen, levalbuterol, ondansetron (ZOFRAN) IV, ondansetron, senna-docusate  Assessment/Plan: 1. New onset expressive aphasia vs word finding difficulty: Ask to eval promptly due to changes in speech which started this morning-time frame unknown. Patient seems to have primary problem with word finding/apparently earlier was more expressive aphasia. She is at high risk for embolic stroke in setting of Afib, she is on therapeutic anticoagulation with coumadin. I had an extensive  discussion with family about how aggressive to be with this stroke work-up. It seems that she has likely had a small embolic stroke or TIA in the setting of Afib/chf and RVR. Essentially we would continue current management with anticioagulation and secondary prevention. I am not sure putting this 78 yo woman through extensive MRI and intervention would be cost effective or helpful to her. Family agree with this plan for conservative management. Will also have SLP see her and make any recommendations. Furthermore I will add on blood work and a UA to make sure this is not from an underlying delirium or another disease process. I have also opted to delay discharge until patient has been observed for another 24 hours with these new findings.  2. A-fib, persistant Being followed by cardiology. Rate controlled, consider LA diltazem, BP in normal range.   3. Hypertension Resuming home meds gradually for BP control.  4. CHF (congestive heart failure), secondary to rapid AF; BNP elevated on admission, tolerated diuresis. Will resume home dose of diuretic cautiously.  5.  Hypokalemia; replete as needed/  6. Chronic anticoagulation pharmacy adjusting coumadin for therapuetic INR.  7. Disp: Patient was planning on going home today with HHS, given change and stroke risk family would like to consider short term rehab or even ALF. I also clarified her code status which is DNR, she has a living will. Will need gold DNR form at discharge.   LOS: 2 days   [facility] Pager: [phone] 03/21/2011, 9:27 PM

## 2011-03-21 NOTE — Progress Notes (Signed)
Per patient's daughter, patient is much more confused and not at baseline mentation.  Patient alert to self and situation.  Has intermittent expressive aphasia and confusion which resolves quickly with redirection and re-orientation.  MD notified.  Will continue to monitor.

## 2011-03-21 NOTE — Progress Notes (Signed)
Speech Language/Pathology Speech Language Pathology Evaluation Patient Details Name: [name] MRN: [id number] DOB: Jan 24, 1919 Today's Date: 03/21/2011  Problem List:  Patient Active Problem List  Diagnoses  . Hypertension  . A-fib, persistant  . Arthritis  . CHF (congestive heart failure), secondary to rapid AF  . Asthma  . Hypokalemia  . Chronic anticoagulation   Past Medical History:  Past Medical History  Diagnosis Date  . Hypertension   . A-fib   . Arthritis    Past Surgical History:  Past Surgical History  Procedure Date  . Other surgical history     bilateral hip replacement  . Joint replacement     bilateral hip replacement    SLP Assessment/Plan/Recommendation  Clinical Impression: Pt presents with declined language function noted by PT with request for SLP eval.   Speech clarity and fluency are WNL.   Assessment revealed difficulty following two-step commands, repeating phrase-length information, and decreased word-retrieval during expression of novel information.  Pt with decreased attention to tasks and decreased awareness of changes.  Rn reports fluctuations in expression/comprehension throughout day.  ?aphasia vs. Language of Confusion.  Etiology uncertain.  Will follow for dx/plan-of-care and to determine if changes are transient in nature.   SLP Recommendation/Assessment:  (Will follow for dx/plan of care) SLP Recommendations:  F/u 2/14 for POC Follow up Recommendations:  (tba)  [name] L. [name], [place] CCC/SLP Pager [phone]   [name] [name] 03/21/2011, 4:18 PM

## 2011-03-21 NOTE — Progress Notes (Signed)
Subjective:  No SOB, pt's daughter reports her mother had trouble with words and following direction. Told by primary service she may have had a stroke. She seems improved now.  No MRI or CT done because it probably wouldn't change current Rx.  Objective:  Vital Signs in the last 24 hours: Temp:  [97.5 F (36.4 C)-98.4 F (36.9 C)] 97.5 F (36.4 C) (02/13 1400) Pulse Rate:  [80-99] 80  (02/13 1400) Resp:  [18-20] 18  (02/13 1400) BP: (115-156)/(71-84) 127/79 mmHg (02/13 1400) SpO2:  [91 %-97 %] 94 % (02/13 1400) Weight:  [65.3 kg (143 lb 15.4 oz)] 65.3 kg (143 lb 15.4 oz) (02/13 0500)  Intake/Output from previous day:  Intake/Output Summary (Last 24 hours) at 03/21/11 1439 Last data filed at 03/21/11 1300  Gross per 24 hour  Intake    600 ml  Output    301 ml  Net    299 ml    Physical Exam: General appearance: alert, cooperative and no distress Lungs: clear to auscultation bilaterally Heart: irregularly irregular rhythm   Rate: 82  Rhythm: atrial fibrillation  Lab Results:  Basename 03/21/11 0607 03/19/11 1240  WBC 10.7* 13.3*  HGB 14.4 13.2  PLT 258 280    Basename 03/21/11 0607 03/20/11 1542  NA 143 141  K 3.3* 3.8  CL 100 99  CO2 30 31  GLUCOSE 102* 106*  BUN 25* 25*  CREATININE 1.15* 1.11*    Basename 03/20/11 1105 03/20/11 0342  TROPONINI <0.30 <0.30   Hepatic Function Panel No results found for this basename: PROT,ALBUMIN,AST,ALT,ALKPHOS,BILITOT,BILIDIR,IBILI in the last 72 hours No results found for this basename: CHOL in the last 72 hours  Basename 03/21/11 0607  INR 2.35*    Imaging: Imaging results have been reviewed  Cardiac Studies:2D-45-50%  Assessment/Plan:   Principal Problem:  *A-fib, persistent  Active Problems:  CHF (congestive heart failure), secondary to rapid AF, suspect secondary to diastolic dysfunction and AF  Hypertension  Arthritis  Asthma  Hypokalemia  ?TIA today  Coumadin Rx, INR has been  theraputic  Plan-Will discuss with MD- ? CT or neurologic eval, Coumadin may not be appropriate if she's had a recent stroke. Change Lasix to po and decrease dose, replace K+, resume po Diltiazem at decreased dose. Consider checking U/A as we are not sure why she decompensated.    Corine Shelter PA-C 03/21/2011, 2:39 PM    Pt admitted with AFIB with RVR. CAF on coumadin AC as an OP. Had assoc CHF prob related to diastolic dysfunction. Therapeutically anticoagulated. Clinically improved. Exam benign. Labs OK. CXR ok. She has had some waxing and waning aphasia. ? TIA. I would recommend getting a neuro consult to eval although I'm not sure there is anything we would otherwise do in this frail elderly woman.   Agree with note written by Corine Shelter [facility]  Nanetta Batty J 03/21/2011 4:20 PM

## 2011-03-21 NOTE — Progress Notes (Addendum)
ANTICOAGULATION CONSULT NOTE - Follow Up Consult  Pharmacy Consult for Coumadin Indication: atrial fibrillation  Allergies  Allergen Reactions  . Shrimp (Shellfish Allergy) Swelling    Patient Measurements: Height: 5\' 3"  (160 cm) Weight: 143 lb 15.4 oz (65.3 kg) IBW/kg (Calculated) : 52.4  Heparin Dosing Weight:   Vital Signs: Temp: 98.4 F (36.9 C) (02/13 0915) Temp src: Oral (02/13 0915) BP: 115/71 mmHg (02/13 0915) Pulse Rate: 94  (02/13 0915)  Labs:  Basename 03/21/11 0607 03/20/11 1542 03/20/11 1105 03/20/11 0343 03/20/11 0342 03/19/11 2115 03/19/11 1240  HGB 14.4 -- -- -- -- -- 13.2  HCT 44.8 -- -- -- -- -- 41.5  PLT 258 -- -- -- -- -- 280  APTT -- -- -- -- -- -- 46*  LABPROT 26.1* -- 28.4* -- -- -- 29.7*  INR 2.35* -- 2.62* -- -- -- 2.77*  HEPARINUNFRC -- -- -- -- -- -- --  CREATININE 1.15* 1.11* -- 0.99 -- -- --  CKTOTAL -- -- 285* -- 289* 343* --  CKMB -- -- 6.7* -- 7.5* 9.8* --  TROPONINI -- -- <0.30 -- <0.30 <0.30 --   Estimated Creatinine Clearance: 29.6 ml/min (by C-G formula based on Cr of 1.15).  Assessment: 89 yof on chronic Coumadin for Afib. INR (2.35) continues to be therapeutic. Spoke with patient and family regarding home dose, they report the regimen is actually 2mg  (rather than 2.5mg ) daily except none on Wednesdays - will continue and make adjustment on Med Rec form.  - H/H and Plts wnl - No significant bleeding reported  Goal of Therapy:  INR 2-3   Plan:  1. Continue home Coumadin regimen (2mg  daily except none on Wed) - no Coumadin due today. 2. Follow-up AM INR and discharge plans  Cleon Dew [phone] 03/21/2011,10:43 AM

## 2011-03-22 DIAGNOSIS — G459 Transient cerebral ischemic attack, unspecified: Secondary | ICD-10-CM | POA: Diagnosis not present

## 2011-03-22 LAB — BASIC METABOLIC PANEL
BUN: 29 mg/dL — ABNORMAL HIGH (ref 6–23)
CO2: 30 mEq/L (ref 19–32)
Calcium: 10 mg/dL (ref 8.4–10.5)
Chloride: 99 mEq/L (ref 96–112)
Creatinine, Ser: 1.2 mg/dL — ABNORMAL HIGH (ref 0.50–1.10)
GFR calc Af Amer: 45 mL/min — ABNORMAL LOW (ref >90)
GFR calc non Af Amer: 39 mL/min — ABNORMAL LOW (ref >90)
Glucose, Bld: 99 mg/dL (ref 70–99)
Potassium: 3.5 mEq/L (ref 3.5–5.1)
Sodium: 143 mEq/L (ref 135–145)

## 2011-03-22 LAB — PROTIME-INR: INR: 2.24 — ABNORMAL HIGH (ref 0.00–1.49)

## 2011-03-22 MED ORDER — DILTIAZEM HCL 60 MG PO TABS
120.0000 mg | ORAL_TABLET | Freq: Once | ORAL | Status: AC
  Administered 2011-03-22: 120 mg via ORAL
  Filled 2011-03-22: qty 2

## 2011-03-22 MED ORDER — DILTIAZEM HCL ER COATED BEADS 240 MG PO CP24
240.0000 mg | ORAL_CAPSULE | Freq: Every day | ORAL | Status: DC
  Administered 2011-03-23: 240 mg via ORAL
  Filled 2011-03-22: qty 1

## 2011-03-22 MED ORDER — ALPRAZOLAM 0.25 MG PO TABS: 0.2500 mg | ORAL_TABLET | Freq: Three times a day (TID) | ORAL | Status: DC | PRN

## 2011-03-22 NOTE — Progress Notes (Signed)
Speech Language/Pathology  S:  F/U after yesterday's speech/language eval.  Pt visiting with daughter at bedside.  O/A:  Pt with much improved comprehension/expression today.  Resolved dysnomia; able to produce conversation-length, propositional speech with no word-retrieval errors.  Improved problem solving with better insight; absence of perseveratory responses.  Still with difficulty executing commands; otherwise, mentation/language impairments appear to be resolving.  Plan:  No SLP f/u warranted - deficits transitory in nature.  Discussed with daughter, who agrees to f/u with physician if communication deteriorates or does not completely resolve.  SLP to sign-off.  [name], [place] CCC/SLP Pager [phone]

## 2011-03-22 NOTE — Progress Notes (Signed)
Subjective: OOB today doing much better, still issues with WF but improved in general.  Objective: Vital signs in last 24 hours: Temp:  [97.5 F (36.4 C)-97.9 F (36.6 C)] 97.5 F (36.4 C) (02/14 1325) Pulse Rate:  [82-115] 106  (02/14 1332) Resp:  [18-20] 20  (02/14 1325) BP: (115-141)/(73-90) 126/73 mmHg (02/14 1332) SpO2:  [93 %-97 %] 97 % (02/14 1325) Weight:  [65 kg (143 lb 4.8 oz)] 65 kg (143 lb 4.8 oz) (02/14 0500) Weight change: -0.3 kg (-10.6 oz) Last BM Date: 03/21/11  Intake/Output from previous day: 02/13 0701 - 02/14 0700 In: 600 [P.O.:600] Out: 600 [Urine:600] Total I/O In: 480 [P.O.:480] Out: -    Physical Exam: General: Alert, awake, oriented x3, in no acute distress. HEENT: No bruits, no goiter. Heart: Regular rate and rhythm, without murmurs, rubs, gallops. Lungs: Clear to auscultation bilaterally. Abdomen: Soft, nontender, nondistended, positive bowel sounds. Extremities: No clubbing cyanosis or edema with positive pedal pulses. Neuro: Grossly intact, nonfocal.    Lab Results: Basic Metabolic Panel:  Basename 03/22/11 0527 03/21/11 0607  NA 143 143  K 3.5 3.3*  CL 99 100  CO2 30 30  GLUCOSE 99 102*  BUN 29* 25*  CREATININE 1.20* 1.15*  CALCIUM 10.0 9.7  MG -- --  PHOS -- --   Liver Function Tests: No results found for this basename: AST:2,ALT:2,ALKPHOS:2,BILITOT:2,PROT:2,ALBUMIN:2 in the last 72 hours No results found for this basename: LIPASE:2,AMYLASE:2 in the last 72 hours No results found for this basename: AMMONIA:2 in the last 72 hours CBC:  Basename 03/21/11 0607  WBC 10.7*  NEUTROABS --  HGB 14.4  HCT 44.8  MCV 90.1  PLT 258   Cardiac Enzymes:  Basename 03/20/11 1105 03/20/11 0342 03/19/11 2115  CKTOTAL 285* 289* 343*  CKMB 6.7* 7.5* 9.8*  CKMBINDEX -- -- --  TROPONINI <0.30 <0.30 <0.30   BNP:  Basename 03/19/11 2102  PROBNP 4199.0*   D-Dimer: No results found for this basename: DDIMER:2 in the last 72  hours CBG: No results found for this basename: GLUCAP:6 in the last 72 hours Hemoglobin A1C: No results found for this basename: HGBA1C in the last 72 hours Fasting Lipid Panel: No results found for this basename: CHOL,HDL,LDLCALC,TRIG,CHOLHDL,LDLDIRECT in the last 72 hours Thyroid Function Tests:  [facility] 03/19/11 2152  TSH 1.313  T4TOTAL --  FREET4 --  T3FREE --  THYROIDAB --   Anemia Panel: No results found for this basename: VITAMINB12,FOLATE,FERRITIN,TIBC,IRON,RETICCTPCT in the last 72 hours Coagulation:  Basename 03/22/11 0527 03/21/11 0607  LABPROT 25.2* 26.1*  INR 2.24* 2.35*   Urine Drug Screen: Drugs of Abuse  No results found for this basename: labopia, cocainscrnur, labbenz, amphetmu, thcu, labbarb    Alcohol Level: No results found for this basename: ETH:2 in the last 72 hours Urinalysis:  Basename 03/21/11 1733  COLORURINE YELLOW  LABSPEC 1.011  PHURINE 6.5  GLUCOSEU NEGATIVE  HGBUR NEGATIVE  BILIRUBINUR NEGATIVE  KETONESUR NEGATIVE  PROTEINUR NEGATIVE  UROBILINOGEN 1.0  NITRITE NEGATIVE  LEUKOCYTESUR SMALL*   Misc. Labs:  No results found for this or any previous visit (from the past 240 hour(s)).  Studies/Results: No results found.  Medications: Scheduled Meds:   . aspirin EC  81 mg Oral Daily  . calcium carbonate  1 tablet Oral Daily  . diltiazem  240 mg Oral Daily  . diltiazem  120 mg Oral Once  . docusate sodium  100 mg Oral BID  . furosemide  40 mg Oral Daily  . metoprolol  tartrate  25 mg Oral BID  . potassium chloride  40 mEq Oral Daily  . tiotropium  18 mcg Inhalation Daily  . warfarin  2 mg Oral Custom  . DISCONTD: diltiazem  120 mg Oral Daily   Continuous Infusions:   . dextrose 5 % and 0.45% NaCl 20 mL/hr (03/19/11 2205)   PRN Meds:.acetaminophen, acetaminophen, ALPRAZolam, HYDROcodone-acetaminophen, levalbuterol, ondansetron (ZOFRAN) IV, ondansetron, senna-docusate  Assessment/Plan:  Principal Problem:  *A-fib,  persistant, rapid VR on admission Active Problems:  Hypertension  Arthritis  CHF, secondary to rapid AF and diastolic dysfunction. EF 45-50%  Asthma  Hypokalemia  Chronic anticoagulation  ?TIA on medication, while Coumadin theraputic  1. New onset expressive aphasia vs word finding difficulty: Appreciate SLP eval. Condition is much improved today, may have been TIA. Now safe for d/c home. 2. A-fib, persistant  Being followed by cardiology. Rate controlled, consider LA diltazem, BP in normal range.  3. Hypertension  Resuming home meds gradually for BP control.  4. CHF (congestive heart failure), secondary to rapid AF; BNP elevated on admission, tolerated diuresis. Will resume home dose of diuretic cautiously.  5. Hypokalemia; replete as needed. 6. Chronic anticoagulation pharmacy adjusting coumadin for therapuetic INR.  7. Disp/Global issues:  Patient is now more weak and immobile. She did ambulate with staff today from bed to her door with the assistance of a walker. At this point the family feels like she maybe maybe more than they can handle at home and will like her to go to short-term rehabilitation and then move into an assisted living-type arrangement. I discontinued her telemetry today. I have also added on low-dose alprazolam for mild anxiety and restlessness after her PT session she also complains of difficulty sleeping at night. Plan for d/c to SNF when bed available.    LOS: 3 days   [facility] Pager: [phone] 03/22/2011, 4:19 PM

## 2011-03-22 NOTE — Progress Notes (Signed)
Physical Therapy Treatment Patient Details Name: [name] MRN: [id number] DOB: December 29, 1918 Today's Date: 03/22/2011  PT Assessment/Plan  PT - Assessment/Plan Comments on Treatment Session: Pt's daughters present at end of treatment session.  Spoke with daughters re: pts mobility status.  Discussed recommendation for SNF if 24 hour supervision not available.  Daughters leaning towards SNF. PT Plan: Frequency remains appropriate;Discharge plan needs to be updated PT Frequency: Min 3X/week Recommendations for Other Services: OT consult Follow Up Recommendations: Skilled nursing facility Equipment Recommended: None recommended by PT PT Goals  Acute Rehab PT Goals PT Goal: Supine/Side to Sit - Progress: Progressing toward goal PT Goal: Sit to Stand - Progress: Progressing toward goal PT Goal: Ambulate - Progress: Progressing toward goal  PT Treatment Precautions/Restrictions  Precautions Precautions: Fall Required Braces or Orthoses: No Restrictions Weight Bearing Restrictions: No Mobility (including Balance) Bed Mobility Bed Mobility: Yes Supine to Sit: 5: Supervision;With rails;HOB flat (increased time to complete tasks) Supine to Sit Details (indicate cue type and reason): Cues for technique and to increase independency Sitting - Scoot to Edge of Bed: 5: Supervision Sit to Supine: 4: Min assist;HOB flat;With rail (Guard/guiding assist) Transfers Transfers: Yes Sit to Stand: 4: Min assist;From bed (guard assist) Stand to Sit: 4: Min assist;To bed;With upper extremity assist (Guard assist) Ambulation/Gait Ambulation/Gait: Yes Ambulation/Gait Assistance: 4: Min assist Ambulation/Gait Assistance Details (indicate cue type and reason): cues for steering and posture.   Ambulation Distance (Feet): 50 Feet Assistive device: Rolling walker Gait Pattern: Step-to pattern;Decreased stride length;Trunk flexed;Shuffle Gait velocity: decreased  Posture/Postural  Control Posture/Postural Control: Postural limitations Postural Limitations: trunk flexed Balance Balance Assessed: No Exercise    End of Session PT - End of Session Equipment Utilized During Treatment: Gait belt Activity Tolerance: Patient tolerated treatment well;Patient limited by fatigue Patient left: in bed;with call bell in reach;with family/visitor present Nurse Communication: Mobility status for transfers;Mobility status for ambulation General Behavior During Session: [facility] for tasks performed Cognition: [facility] for tasks performed Cognitive Impairment: See SLP note-aphasia appears resolved.  Newell Coral 03/22/2011, 2:03 PM  Newell Coral, [place] [phone]

## 2011-03-22 NOTE — Progress Notes (Signed)
Subjective:  Much better this am. Mental status seems clear.  Objective:  Vital Signs in the last 24 hours: Temp:  [97.5 F (36.4 C)-97.9 F (36.6 C)] 97.9 F (36.6 C) (02/14 0500) Pulse Rate:  [80-115] 88  (02/14 0500) Resp:  [18] 18  (02/13 2200) BP: (115-141)/(78-90) 115/90 mmHg (02/14 0500) SpO2:  [93 %-94 %] 94 % (02/14 0852) Weight:  [65 kg (143 lb 4.8 oz)] 65 kg (143 lb 4.8 oz) (02/14 0500)  Intake/Output from previous day:  Intake/Output Summary (Last 24 hours) at 03/22/11 0941 Last data filed at 03/22/11 0600  Gross per 24 hour  Intake    600 ml  Output    600 ml  Net      0 ml    Physical Exam: General appearance: alert, cooperative and no distress Lungs: clear to auscultation bilaterally Heart: irregularly irregular rhythm   Rate: 80-135  Rhythm: atrial fibrillation  Lab Results:  Basename 03/21/11 0607 03/19/11 1240  WBC 10.7* 13.3*  HGB 14.4 13.2  PLT 258 280    Basename 03/22/11 0527 03/21/11 0607  NA 143 143  K 3.5 3.3*  CL 99 100  CO2 30 30  GLUCOSE 99 102*  BUN 29* 25*  CREATININE 1.20* 1.15*    Basename 03/20/11 1105 03/20/11 0342  TROPONINI <0.30 <0.30   Hepatic Function Panel No results found for this basename: PROT,ALBUMIN,AST,ALT,ALKPHOS,BILITOT,BILIDIR,IBILI in the last 72 hours No results found for this basename: CHOL in the last 72 hours  Basename 03/22/11 0527  INR 2.24*    Imaging: No results found.  Cardiac Studies:  Assessment/Plan:   Principal Problem:  *A-fib, persistant, rapid VR on admission, HR still elevated when up  Active Problems:  CHF, secondary to rapid AF and diastolic dysfunction. EF 45-50%  ?TIA on medication, while Coumadin theraputic  Hypertension  Arthritis  Asthma  Hypokalemia  Chronic anticoagulation  Plan-In retrospect symptoms yesterday and initial decompensation may all have been from UTI. Increase Diltiazem to 240mg  daily. We will arrange follow up with Dr Rennis Golden in one to two weeks.  INR followed by her primary MD Dr Nathanial Rancher.    Corine Shelter PA-C 03/22/2011, 9:41 AM Agree with note written by Corine Shelter [facility]  Cardiac stable. HR controlled. Meds adjusted. INR therapeutic. Etiology of AMS still unclear. Appears euvolemic. UA really not c/w UTI but mental status clear today. Will s/o. Call if we can be of further asistance    Runell Gess 03/22/2011 3:04 PM

## 2011-03-22 NOTE — Progress Notes (Signed)
Spoke with daughter Burna Mortimer in the pts room,  Daughter states pt is in no condition to go home the way she is she can barely walk from the chair.  Daughter states they are looking at other options like snf.  I asked pt if she is agreeable to this , the patient states if this will help her get betters.  Informed CSW and physical therapy for re -eval for snf.  A physical therapist will be seeing pt today.

## 2011-03-22 NOTE — Progress Notes (Signed)
ANTICOAGULATION CONSULT NOTE - Follow Up Consult  Pharmacy Consult for Coumadin Indication: atrial fibrillation  Allergies  Allergen Reactions  . Shrimp (Shellfish Allergy) Swelling    Patient Measurements: Height: 5\' 3"  (160 cm) Weight: 143 lb 4.8 oz (65 kg) IBW/kg (Calculated) : 52.4   Vital Signs: Temp: 97.9 F (36.6 C) (02/14 0500) BP: 118/82 mmHg (02/14 0955) Pulse Rate: 82  (02/14 0955)  Labs:  Basename 03/22/11 0527 03/21/11 0607 03/20/11 1542 03/20/11 1105 03/20/11 0342 03/19/11 2115 03/19/11 1240  HGB -- 14.4 -- -- -- -- 13.2  HCT -- 44.8 -- -- -- -- 41.5  PLT -- 258 -- -- -- -- 280  APTT -- -- -- -- -- -- 46*  LABPROT 25.2* 26.1* -- 28.4* -- -- --  INR 2.24* 2.35* -- 2.62* -- -- --  HEPARINUNFRC -- -- -- -- -- -- --  CREATININE 1.20* 1.15* 1.11* -- -- -- --  CKTOTAL -- -- -- 285* 289* 343* --  CKMB -- -- -- 6.7* 7.5* 9.8* --  TROPONINI -- -- -- <0.30 <0.30 <0.30 --   Estimated Creatinine Clearance: 28.2 ml/min (by C-G formula based on Cr of 1.2).  Assessment: 78 yo F on Coumadin PTA for hx  AFib.  INR therapeutic on home regimen.  No bleeding noted.  No change needed.  Goal of Therapy:  INR 2-3   Plan:  1. Continue home Coumadin 2mg  daily except none on Wed.  2. Follow-up AM INR and discharge plans   [facility], Pharm.D., BCPS Clinical Pharmacist Pager [phone]  03/22/2011,11:07 AM

## 2011-03-23 LAB — CBC
MCH: 30.1 pg (ref 26.0–34.0)
MCHC: 33 g/dL (ref 30.0–36.0)
MCV: 91.2 fL (ref 78.0–100.0)
Platelets: 278 10*3/uL (ref 150–400)
RBC: 5.11 MIL/uL (ref 3.87–5.11)
RDW: 14.2 % (ref 11.5–15.5)

## 2011-03-23 LAB — PROTIME-INR: Prothrombin Time: 25.1 seconds — ABNORMAL HIGH (ref 11.6–15.2)

## 2011-03-23 MED ORDER — TIOTROPIUM BROMIDE MONOHYDRATE 18 MCG IN CAPS: 18.0000 ug | ORAL_CAPSULE | Freq: Every day | RESPIRATORY_TRACT | Status: DC

## 2011-03-23 MED ORDER — LEVALBUTEROL HCL 0.63 MG/3ML IN NEBU: 0.6300 mg | INHALATION_SOLUTION | Freq: Four times a day (QID) | RESPIRATORY_TRACT | Status: DC | PRN

## 2011-03-23 MED ORDER — ALPRAZOLAM 0.25 MG PO TABS: 0.2500 mg | ORAL_TABLET | Freq: Three times a day (TID) | ORAL | Status: AC | PRN

## 2011-03-23 MED ORDER — WARFARIN SODIUM 2 MG PO TABS: 2.0000 mg | ORAL_TABLET | Freq: Every day | ORAL | Status: DC

## 2011-03-23 MED ORDER — ACETAMINOPHEN 325 MG PO TABS: 650.0000 mg | ORAL_TABLET | Freq: Four times a day (QID) | ORAL | Status: DC | PRN

## 2011-03-23 MED ORDER — DSS 100 MG PO CAPS: 100.0000 mg | ORAL_CAPSULE | Freq: Two times a day (BID) | ORAL | Status: AC

## 2011-03-23 MED ORDER — CALCIUM CARBONATE 1250 (500 CA) MG PO TABS: 1.0000 | ORAL_TABLET | Freq: Every day | ORAL | Status: DC

## 2011-03-23 MED ORDER — FUROSEMIDE 40 MG PO TABS: 40.0000 mg | ORAL_TABLET | Freq: Every day | ORAL | Status: DC

## 2011-03-23 MED ORDER — DILTIAZEM HCL ER COATED BEADS 240 MG PO CP24: 240.0000 mg | ORAL_CAPSULE | Freq: Every day | ORAL | Status: DC

## 2011-03-23 MED ORDER — SENNOSIDES-DOCUSATE SODIUM 8.6-50 MG PO TABS: 1.0000 | ORAL_TABLET | Freq: Every evening | ORAL | Status: DC | PRN

## 2011-03-23 MED ORDER — METOPROLOL TARTRATE 25 MG PO TABS: 25.0000 mg | ORAL_TABLET | Freq: Two times a day (BID) | ORAL | Status: DC

## 2011-03-23 MED ORDER — POTASSIUM CHLORIDE CRYS ER 20 MEQ PO TBCR: 40.0000 meq | EXTENDED_RELEASE_TABLET | Freq: Every day | ORAL | Status: DC

## 2011-03-23 MED ORDER — HYDROCODONE-ACETAMINOPHEN 5-325 MG PO TABS: 1.0000 | ORAL_TABLET | Freq: Two times a day (BID) | ORAL | Status: AC | PRN

## 2011-03-23 MED ORDER — ASPIRIN 81 MG PO TBEC: 81.0000 mg | DELAYED_RELEASE_TABLET | Freq: Every day | ORAL | Status: DC

## 2011-03-23 NOTE — Progress Notes (Signed)
DC orders received.  Patient stable with no S/S of distress.  Discharge and medication instructions reviewed with patient and patient's daughter.  Multiple unsuccessful attempts to call report to [facility] in [place].  Patient DC to SNF via private vehicle. Nolon Nations

## 2011-03-23 NOTE — Progress Notes (Signed)
Discharge Summary and orders written. Noted decrease in Hb this AM and restless legs, will check Hb before finalizing d/c plans. Waiting on SNFbed.

## 2011-03-23 NOTE — Discharge Summary (Signed)
Physician Discharge Summary  Patient ID: [name] MRN: [id number] DOB/AGE: July 09, 1918 78 y.o.  Admit date: 03/19/2011 Discharge date: 03/23/2011  Primary Care Physician:  Ailene Ravel, MD, MD   Discharge Diagnoses:    Principal Problem:  *A-fib, persistant, rapid VR on admission Active Problems:  Hypertension  Arthritis  CHF, secondary to rapid AF and diastolic dysfunction. EF 45-50%  Asthma  Hypokalemia  Chronic anticoagulation  ?TIA on medication, while Coumadin theraputic    Medication List  As of 03/23/2011  7:59 AM   TAKE these medications         acetaminophen 325 MG tablet   Commonly known as: TYLENOL   Take 2 tablets (650 mg total) by mouth every 6 (six) hours as needed (or Fever >/= 101).      ALPRAZolam 0.25 MG tablet   Commonly known as: XANAX   Take 1 tablet (0.25 mg total) by mouth 3 (three) times daily as needed for anxiety.      aspirin 81 MG EC tablet   Take 1 tablet (81 mg total) by mouth daily.      calcium carbonate 1250 MG tablet   Commonly known as: OS-CAL - dosed in mg of elemental calcium   Take 1 tablet (500 mg of elemental calcium total) by mouth daily.      diltiazem 240 MG 24 hr capsule   Commonly known as: CARDIZEM CD   Take 1 capsule (240 mg total) by mouth daily.      DSS 100 MG Caps   Take 100 mg by mouth 2 (two) times daily.      furosemide 40 MG tablet   Commonly known as: LASIX   Take 1 tablet (40 mg total) by mouth daily.      HYDROcodone-acetaminophen 5-325 MG per tablet   Commonly known as: NORCO   Take 1 tablet by mouth 2 (two) times daily as needed.      levalbuterol 0.63 MG/3ML nebulizer solution   Commonly known as: XOPENEX   Take 3 mLs (0.63 mg total) by nebulization every 6 (six) hours as needed for wheezing or shortness of breath.      metoprolol tartrate 25 MG tablet   Commonly known as: LOPRESSOR   Take 1 tablet (25 mg total) by mouth 2 (two) times daily.      potassium chloride SA 20 MEQ tablet     Commonly known as: K-DUR,KLOR-CON   Take 2 tablets (40 mEq total) by mouth daily.      senna-docusate 8.6-50 MG per tablet   Commonly known as: Senokot-S   Take 1 tablet by mouth at bedtime as needed for constipation.      tiotropium 18 MCG inhalation capsule   Commonly known as: SPIRIVA   Place 1 capsule (18 mcg total) into inhaler and inhale daily.      warfarin 2 MG tablet   Commonly known as: COUMADIN   Take 1 tablet (2 mg total) by mouth daily.         ASK your doctor about these medications         CALTRATE 600 1500 MG Tabs   Generic drug: Calcium Carbonate   Take 1,500 mg by mouth once.      diltiazem 360 MG 24 hr capsule   Commonly known as: CARDIZEM CD   Take 360 mg by mouth daily. For high BP      HYDROcodone-acetaminophen 10-650 MG per tablet   Commonly known as: LORCET   Take 0.5  tablets by mouth 4 (four) times daily as needed. For pain      lisinopril 40 MG tablet   Commonly known as: PRINIVIL,ZESTRIL   Take 40 mg by mouth daily.      warfarin 2 MG tablet   Commonly known as: COUMADIN   Take 2 mg by mouth See admin instructions. Pt takes 1 tablet (2mg ) every day except NONE on Wednesdays             Disposition and Follow-up:  Patient to go to SNF for short term rehab to regain her strength and mobility. Family is working on ALF placement. She needs to have INR checked in 2-3 days following discharge and coumadin will need to be adjusted.  Consults: none  Significant Diagnostic Studies:  Dg Chest Portable 1 View  03/19/2011  *RADIOLOGY REPORT*  Clinical Data: Shortness of breath.  PORTABLE CHEST - 1 VIEW  Comparison: 04/24/2007  Findings: There is chronic cardiomegaly.  Pulmonary vascularity is normal.  Slight haziness at the left lung base may represent slight atelectasis.  No acute osseous abnormalities.  IMPRESSION: Chronic cardiomegaly.  Probable slight atelectasis at the left base.  Original Report Authenticated By: Gwynn Burly, M.D.     Brief H and P: 78 year old woman previously healthy living independently admitted on 03/19/11 with clinical syndrome of congestive heart failure, probably precipitated by atrial fibrillation with rapid ventricular response.   Hospital Course:   Principal Problem:  *A-fib, persistant, rapid VR on admission: Patient was admitted placed on cardizem gtt and rate was controlled. She transitioned to PO Diltiazem and has been doing well. BP has normalized. She was continued on her oral B-Blocker. Underlying source of the RVR felt to be hypokalemia (2.9 on admission) or mild anemia (Hb 11.0), other w/u has been negative. She has no evidence of infection, ischemia or other precipitating factors. Patient has been stable on oral regimen.  This hospital course was complicated by the patient presenting with expressive aphasia issues on HOD#2 with word finding that was transient, felt to be TIA. After discussion with family a comprehensive stroke work up was deferred. She is already on coumadin for anticoagulation and there is minimal other intervention available. 2D echo did not show any source or mural thrombus.   Other Active Problems:  Hypertension: well controlled on Diltiazem and B-Blocker   Arthritis: Hydrocodone prn effective.   CHF, secondary to rapid AF and diastolic dysfunction. EF 45-50%: Standing dose of Lasix and KCl repletion in addition to B-Blocker. Not on ACE due to renal function.   Asthma; Pateint is on Albuterol nebs and Spiriva, doing well. No wheezing or dyspnea on this hospitalization.   Hypokalemia: On chronic oral repletion, no change, has been stable.   Chronic anticoagulation: will need Hb check and INR check in 2-3 days as well as ongoing coumadin management. Continue home Coumadin 2mg  daily except none on Wed.   Restless Leg/Agitation: Started on low dose Alprazolam qhs, very helpful. Pateint reported better sleep and less agitation with her legs. Will check a CBC prior  to discharge since anemia can make RLS worse.  Time spent on Discharge: 45 minutes  Signed: [facility] Pager: [phone] 03/23/2011, 7:59 AM

## 2011-03-23 NOTE — Clinical Documentation Improvement (Signed)
CHF DOCUMENTATION CLARIFICATION QUERY  THIS DOCUMENT IS NOT A PERMANENT PART OF THE MEDICAL RECORD  TO RESPOND TO THE THIS QUERY, FOLLOW THE INSTRUCTIONS BELOW:  1. If needed, update documentation for the patient's encounter via the notes activity.  2. Access this query again and click edit on the In [employer].  3. After updating, or not, click F2 to complete all highlighted (required) fields concerning your review. Select "additional documentation in the medical record" OR "no additional documentation provided".  4. Click Sign note button.  5. The deficiency will fall out of your In Basket *Please let us know if you are not able to complete this workflow by phone or e-mail (listed below).  Please update your documentation within the medical record to reflect your response to this query.                                                                                    03/23/11  Dear Dr. [facility],  In a better effort to capture your patient's severity of illness, reflect appropriate length of stay and utilization of resources, a review of the patient medical record has revealed the following indicators the diagnosis of Heart Failure.    In responding to this query please exercise your independent judgment.  The fact that a query is asked, does not imply that any particular answer is desired or expected. THANK YOU FOR CLARIFYING ACUITY OF CHF IN PN/DS.  Possible Clinical Conditions? Marland Kitchen Acute Diastolic Congestive Heart Failure . Acute on Chronic Diastolic Congestive Heart Failure . Chronic Diastolic Congestive Heart Failure . Other Condition . Cannot Clinically Determine  Supporting Information: - "CHF, secondary to rapid AF; BNP elevated on admission, tolerated diuresis"  Reviewed: additional documentation in the medical record  Thank You,  Beverley Fiedler RN Clinical Documentation Specialist: Cell: [phone]  Health Information Management [facility]

## 2011-03-23 NOTE — Progress Notes (Signed)
Discussed and agree with above.03/23/2011  [place], [place] [phone] (pager)

## 2011-03-23 NOTE — Progress Notes (Signed)
ANTICOAGULATION CONSULT NOTE - Follow Up Consult  Pharmacy Consult for Coumadin Indication: atrial fibrillation  Allergies  Allergen Reactions  . Shrimp (Shellfish Allergy) Swelling    Patient Measurements: Height: 5\' 3"  (160 cm) Weight: 143 lb 4.8 oz (65 kg) IBW/kg (Calculated) : 52.4   Vital Signs: Temp: 98 F (36.7 C) (02/15 0600) Temp src: Oral (02/15 0600) BP: 144/92 mmHg (02/15 1012) Pulse Rate: 85  (02/15 1012)  Labs:  Basename 03/23/11 0901 03/23/11 0500 03/22/11 0527 03/21/11 0607 03/20/11 1542  HGB 15.4* -- -- 14.4 --  HCT 46.6* -- -- 44.8 --  PLT 278 -- -- 258 --  APTT -- -- -- -- --  LABPROT -- 25.1* 25.2* 26.1* --  INR -- 2.23* 2.24* 2.35* --  HEPARINUNFRC -- -- -- -- --  CREATININE -- -- 1.20* 1.15* 1.11*  CKTOTAL -- -- -- -- --  CKMB -- -- -- -- --  TROPONINI -- -- -- -- --   Estimated Creatinine Clearance: 28.2 ml/min (by C-G formula based on Cr of 1.2).  Assessment: 78 yo F on Coumadin PTA for hx  AFib.  INR therapeutic on home regimen.  No bleeding noted.  No change needed.  Goal of Therapy:  INR 2-3   Plan:  1. Continue home Coumadin 2mg  daily except none on Wed.  2. Follow-up AM INR and discharge plans   [facility], Pharm.D., BCPS Clinical Pharmacist Pager [phone]  03/23/2011,1:21 PM

## 2011-08-30 ENCOUNTER — Emergency Department (HOSPITAL_COMMUNITY): Payer: Medicare Other

## 2011-08-30 ENCOUNTER — Encounter (HOSPITAL_COMMUNITY): Payer: Self-pay | Admitting: *Deleted

## 2011-08-30 ENCOUNTER — Emergency Department (HOSPITAL_COMMUNITY)
Admission: EM | Admit: 2011-08-30 | Discharge: 2011-08-30 | Disposition: A | Discharge status: Home / Self Care | Payer: Medicare Other | Attending: Emergency Medicine | Admitting: Emergency Medicine

## 2011-08-30 DIAGNOSIS — I4891 Unspecified atrial fibrillation: Secondary | ICD-10-CM | POA: Insufficient documentation

## 2011-08-30 DIAGNOSIS — Z79899 Other long term (current) drug therapy: Secondary | ICD-10-CM | POA: Insufficient documentation

## 2011-08-30 DIAGNOSIS — G510 Bell's palsy: Secondary | ICD-10-CM

## 2011-08-30 DIAGNOSIS — Z7982 Long term (current) use of aspirin: Secondary | ICD-10-CM | POA: Insufficient documentation

## 2011-08-30 DIAGNOSIS — Z8739 Personal history of other diseases of the musculoskeletal system and connective tissue: Secondary | ICD-10-CM | POA: Insufficient documentation

## 2011-08-30 DIAGNOSIS — I10 Essential (primary) hypertension: Secondary | ICD-10-CM | POA: Insufficient documentation

## 2011-08-30 LAB — CBC WITH DIFFERENTIAL/PLATELET
Basophils Absolute: 0.1 10*3/uL (ref 0.0–0.1)
Basophils Relative: 1 % (ref 0–1)
Eosinophils Absolute: 0.2 K/uL (ref 0.0–0.7)
Eosinophils Relative: 2 % (ref 0–5)
HCT: 42.5 % (ref 36.0–46.0)
Hemoglobin: 13.8 g/dL (ref 12.0–15.0)
Lymphocytes Relative: 26 % (ref 12–46)
Lymphs Abs: 2.2 K/uL (ref 0.7–4.0)
MCH: 29.4 pg (ref 26.0–34.0)
MCHC: 32.5 g/dL (ref 30.0–36.0)
MCV: 90.4 fL (ref 78.0–100.0)
Monocytes Absolute: 0.7 K/uL (ref 0.1–1.0)
Monocytes Relative: 8 % (ref 3–12)
Neutro Abs: 5.3 10*3/uL (ref 1.7–7.7)
Neutrophils Relative %: 63 % (ref 43–77)
Platelets: 258 K/uL (ref 150–400)
RBC: 4.7 MIL/uL (ref 3.87–5.11)
RDW: 13.4 % (ref 11.5–15.5)
WBC: 8.4 10*3/uL (ref 4.0–10.5)

## 2011-08-30 LAB — PROTIME-INR
INR: 2.18 — ABNORMAL HIGH (ref 0.00–1.49)
Prothrombin Time: 24.6 s — ABNORMAL HIGH (ref 11.6–15.2)

## 2011-08-30 LAB — COMPREHENSIVE METABOLIC PANEL WITH GFR
ALT: 15 U/L (ref 0–35)
BUN: 29 mg/dL — ABNORMAL HIGH (ref 6–23)
Calcium: 10.1 mg/dL (ref 8.4–10.5)
Creatinine, Ser: 1.32 mg/dL — ABNORMAL HIGH (ref 0.50–1.10)
GFR calc Af Amer: 40 mL/min — ABNORMAL LOW (ref >90)
Glucose, Bld: 128 mg/dL — ABNORMAL HIGH (ref 70–99)
Sodium: 141 meq/L (ref 135–145)
Total Protein: 7.5 g/dL (ref 6.0–8.3)

## 2011-08-30 LAB — COMPREHENSIVE METABOLIC PANEL
AST: 28 U/L (ref 0–37)
Albumin: 3.6 g/dL (ref 3.5–5.2)
Alkaline Phosphatase: 110 U/L (ref 39–117)
CO2: 31 mEq/L (ref 19–32)
Chloride: 99 mEq/L (ref 96–112)
GFR calc non Af Amer: 34 mL/min — ABNORMAL LOW (ref >90)
Potassium: 3.9 mEq/L (ref 3.5–5.1)
Total Bilirubin: 0.3 mg/dL (ref 0.3–1.2)

## 2011-08-30 LAB — TROPONIN I: Troponin I: <0.3 ng/mL (ref <0.30)

## 2011-08-30 NOTE — ED Notes (Signed)
The pt has  A rt sided facial droop just noticed at 1300 today by her family.  She has no  Pain no arm drift .  The rt side of her face is drooping still.  But we have no idea when she was last seen normal.  She has a steady gait

## 2011-08-30 NOTE — ED Provider Notes (Signed)
History     CSN: [id number]  Arrival date & time 08/30/11  1846   First MD Initiated Contact with Patient 08/30/11 1920      Chief Complaint  Patient presents with  . stroke-like symptoms     (Consider location/radiation/quality/duration/timing/severity/associated sxs/prior treatment) HPI Comments: [name] is a 78 y.o. Female who presents to ER after a family member noticed today when visiting her that she had a right sided facial droop. Pt denies any complaints. She denies headache, numbness, difficulty walking, talking, weakness of extremeties. Unsure when last was seen normal.  Hx of TIAs and strokes.   The history is provided by the patient and a relative.    Past Medical History  Diagnosis Date  . Hypertension   . A-fib   . Arthritis     Past Surgical History  Procedure Date  . Other surgical history     bilateral hip replacement  . Joint replacement     bilateral hip replacement    No family history on file.  History  Substance Use Topics  . Smoking status: Never Smoker   . Smokeless tobacco: Not on file  . Alcohol Use: No    OB History    Grav Para Term Preterm Abortions TAB SAB Ect Mult Living                  Review of Systems  Constitutional: Negative for fever and chills.  HENT: Negative for neck pain and neck stiffness.   Eyes: Negative for pain and visual disturbance.  Respiratory: Negative.   Cardiovascular: Negative.   Gastrointestinal: Negative.   Genitourinary: Negative.   Musculoskeletal: Negative.   Skin: Negative.   Neurological: Positive for facial asymmetry. Negative for dizziness, speech difficulty, weakness, light-headedness, numbness and headaches.    Allergies  Shrimp  Home Medications   Current Outpatient Rx  Name Route Sig Dispense Refill  . ASPIRIN 81 MG PO TBEC Oral Take 81 mg by mouth daily.    Marland Kitchen CALCIUM CARBONATE 1500 MG PO TABS Oral Take 1,500 mg by mouth once.    Marland Kitchen CALCIUM CARBONATE 1250 MG PO TABS Oral  Take 1 tablet by mouth daily.    Marland Kitchen DILTIAZEM HCL ER COATED BEADS 240 MG PO CP24 Oral Take 240 mg by mouth daily.    . FUROSEMIDE 40 MG PO TABS Oral Take 40 mg by mouth 2 (two) times daily.    Marland Kitchen HYDROCODONE-ACETAMINOPHEN 10-650 MG PO TABS Oral Take 0.5 tablets by mouth 4 (four) times daily as needed. For pain    . METOPROLOL TARTRATE 25 MG PO TABS Oral Take 25 mg by mouth 2 (two) times daily.    Marland Kitchen POTASSIUM CHLORIDE CRYS ER 20 MEQ PO TBCR Oral Take 40 mEq by mouth daily.    Marland Kitchen PREDNISOLONE ACETATE OP Right Eye Place 1 drop into the right eye as directed.    . WARFARIN SODIUM 2 MG PO TABS Oral Take 2 mg by mouth daily.      BP 144/81  Pulse 72  Temp 97.8 F (36.6 C) (Oral)  Resp 16  SpO2 99%  Physical Exam  Nursing note and vitals reviewed. Constitutional: She is oriented to person, place, and time. She appears well-developed and well-nourished. No distress.  HENT:  Head: Normocephalic.  Right Ear: External ear normal.  Left Ear: External ear normal.  Nose: Nose normal.  Mouth/Throat: Oropharynx is clear and moist.  Eyes: Conjunctivae and EOM are normal.  Chronic surgical post catarc surgery eye changes  Neck: Normal range of motion. Neck supple.  Cardiovascular: Normal rate, S1 normal and S2 normal.  An irregularly irregular rhythm present.  Pulmonary/Chest: Effort normal and breath sounds normal. No respiratory distress. She has no wheezes. She has no rales.  Abdominal: Soft. Bowel sounds are normal. She exhibits no distension. There is no tenderness.  Musculoskeletal: Normal range of motion. She exhibits no edema.  Neurological: She is alert and oriented to person, place, and time.       Pt when blinking is not closing right eye, but able to squeeze eyes shut actively. Pt able to raise both eyebrows equally. Right mouth drooping when smiling. Normal shoulder shrug. Equal grip strength bilat. No pronator drift. Normal coordination.  Skin: Skin is warm and dry.  Psychiatric:  She has a normal mood and affect.    ED Course  Procedures (including critical care time)  Pt with Right facial drooping. Hx of CVAs. Pt is not blinking in the right eye, but actively has normal right eye strength and nromal raising eye brows. CVA vs bells palsy will get labs, CT  Results for orders placed during the hospital encounter of 08/30/11  CBC WITH DIFFERENTIAL      Component Value Range   WBC 8.4  4.0 - 10.5 K/uL   RBC 4.70  3.87 - 5.11 MIL/uL   Hemoglobin 13.8  12.0 - 15.0 g/dL   HCT 16.1  09.6 - 04.5 %   MCV 90.4  78.0 - 100.0 fL   MCH 29.4  26.0 - 34.0 pg   MCHC 32.5  30.0 - 36.0 g/dL   RDW 40.9  81.1 - 91.4 %   Platelets 258  150 - 400 K/uL   Neutrophils Relative 63  43 - 77 %   Neutro Abs 5.3  1.7 - 7.7 K/uL   Lymphocytes Relative 26  12 - 46 %   Lymphs Abs 2.2  0.7 - 4.0 K/uL   Monocytes Relative 8  3 - 12 %   Monocytes Absolute 0.7  0.1 - 1.0 K/uL   Eosinophils Relative 2  0 - 5 %   Eosinophils Absolute 0.2  0.0 - 0.7 K/uL   Basophils Relative 1  0 - 1 %   Basophils Absolute 0.1  0.0 - 0.1 K/uL  COMPREHENSIVE METABOLIC PANEL      Component Value Range   Sodium 141  135 - 145 mEq/L   Potassium 3.9  3.5 - 5.1 mEq/L   Chloride 99  96 - 112 mEq/L   CO2 31  19 - 32 mEq/L   Glucose, Bld 128 (*) 70 - 99 mg/dL   BUN 29 (*) 6 - 23 mg/dL   Creatinine, Ser 7.82 (*) 0.50 - 1.10 mg/dL   Calcium 95.6  8.4 - 21.3 mg/dL   Total Protein 7.5  6.0 - 8.3 g/dL   Albumin 3.6  3.5 - 5.2 g/dL   AST 28  0 - 37 U/L   ALT 15  0 - 35 U/L   Alkaline Phosphatase 110  39 - 117 U/L   Total Bilirubin 0.3  0.3 - 1.2 mg/dL   GFR calc non Af Amer 34 (*) >90 mL/min   GFR calc Af Amer 40 (*) >90 mL/min  PROTIME-INR      Component Value Range   Prothrombin Time 24.6 (*) 11.6 - 15.2 seconds   INR 2.18 (*) 0.00 - 1.49  TROPONIN I  Component Value Range   Troponin I <0.30  <0.30 ng/mL   Ct Head Wo Contrast  08/30/2011  *RADIOLOGY REPORT*  Clinical Data: Right-sided facial droop.   CT HEAD WITHOUT CONTRAST  Technique:  Contiguous axial images were obtained from the base of the skull through the vertex without contrast.  Comparison: None.  Findings: Small vessel disease is present with at least one focal old lacune localizing to the margin of the anterior limb of the right internal capsule. The brain demonstrates no evidence of hemorrhage, acute infarction, edema, mass effect, extra-axial fluid collection, hydrocephalus or mass lesion.  The skull is unremarkable.  IMPRESSION: No acute findings.  Small vessel disease with old right-sided lacune of the internal capsule.  Original Report Authenticated By: Reola Calkins, M.D.    Date: 08/30/2011  Rate: 61  Rhythm: atrial fibrillation  QRS Axis: right  Intervals: normal  ST/T Wave abnormalities: normal  Conduction Disutrbances:none  Narrative Interpretation: Q waves septally  Old EKG Reviewed: none available    CT negative. Will get MRI to rule out acute CVA    Mr Brain Wo Contrast  08/30/2011  *RADIOLOGY REPORT*  Clinical Data: Right-sided facial droop.  Abnormal head CT.  MRI HEAD WITHOUT CONTRAST  Technique:  Multiplanar, multiecho pulse sequences of the brain and surrounding structures were obtained according to standard protocol without intravenous contrast.  Comparison: Head CT same day  Findings: Diffusion imaging does not show any acute or subacute infarction.  The brain shows generalized age related atrophy.  The brainstem and cerebellum are otherwise unremarkable.  The cerebral hemispheres show mild chronic small vessel changes within the hemispheric white matter, less than often seen in healthy individuals of this age.  No cortical or large vessel territory infarction.  No mass lesion, hemorrhage, hydrocephalus or extra- axial collection.  No fluid in the paranasal sinuses.  Tiny amount of fluid in the mastoid regions, probably not significant.  No skull or skull base lesion.  IMPRESSION: No acute or reversible  findings.  Ordinary age related atrophy and chronic small vessel disease.  Original Report Authenticated By: Thomasenia Sales, M.D.   10:08 PM MRI negative. Family and pt informed of results. Suspect bells palsy. Will d/c home with follow up. Saline drops for eye moisturizing.   Filed Vitals:   08/30/11 1847  BP: 144/81  Pulse: 72  Temp: 97.8 F (36.6 C)  Resp: 16        1. Bell's palsy       MDM  Right facial drooping. Unremarkable labs. VS normal other than slight HTN. Given pt's history and sparring of forehead/eyebrows, MRI obtained to r/o CVA. It is negative. D/c home with follow up.         Lottie Mussel, PA 08/30/11 2210

## 2011-08-30 NOTE — ED Notes (Signed)
Patient transported to CT 

## 2011-08-30 NOTE — ED Provider Notes (Signed)
Medical screening examination/treatment/procedure(s) were conducted as a shared visit with non-physician practitioner(s) and myself.  I personally evaluated the patient during the encounter   Pt with isolated facial droop, no sig weakness to arms or legs that is asymetric.  MRI shows no acute CVA.  I suspect Bell's palsy.  Pt discharged to home to f/u with PCP.  Pt is not sig HTN.    [name]. Oletta Lamas, MD 08/30/11 2226

## 2011-08-30 NOTE — ED Notes (Signed)
Patient transported to MRI 

## 2011-08-30 NOTE — ED Notes (Addendum)
Pt's daughter reports she went over to pt's house around 1300, noticed significant right sided facial droop. Pt is A&OX4, pt reports she didn't notice any difference. Pt reports she got up and went to breakfast around 0730, nobody noticed any difference in her.

## 2013-03-20 ENCOUNTER — Other Ambulatory Visit: Payer: Self-pay | Admitting: Geriatric Medicine

## 2013-03-20 ENCOUNTER — Ambulatory Visit
Admission: RE | Admit: 2013-03-20 | Discharge: 2013-03-20 | Disposition: A | Discharge status: Home / Self Care | Payer: Medicare Other | Source: Ambulatory Visit | Attending: Geriatric Medicine | Admitting: Geriatric Medicine

## 2013-03-20 DIAGNOSIS — M549 Dorsalgia, unspecified: Secondary | ICD-10-CM

## 2014-04-13 ENCOUNTER — Emergency Department (HOSPITAL_COMMUNITY): Payer: Medicare Other

## 2014-04-13 ENCOUNTER — Encounter (HOSPITAL_COMMUNITY): Payer: Self-pay | Admitting: Emergency Medicine

## 2014-04-13 ENCOUNTER — Inpatient Hospital Stay (HOSPITAL_COMMUNITY)
Admission: EM | Admit: 2014-04-13 | Discharge: 2014-04-16 | DRG: 308 | Disposition: A | Discharge status: Skilled Nursing Facility | Payer: Medicare Other | Attending: Internal Medicine | Admitting: Internal Medicine

## 2014-04-13 DIAGNOSIS — G9341 Metabolic encephalopathy: Secondary | ICD-10-CM | POA: Diagnosis present

## 2014-04-13 DIAGNOSIS — I509 Heart failure, unspecified: Secondary | ICD-10-CM

## 2014-04-13 DIAGNOSIS — I482 Chronic atrial fibrillation, unspecified: Secondary | ICD-10-CM | POA: Diagnosis present

## 2014-04-13 DIAGNOSIS — D72829 Elevated white blood cell count, unspecified: Secondary | ICD-10-CM | POA: Diagnosis present

## 2014-04-13 DIAGNOSIS — R531 Weakness: Secondary | ICD-10-CM | POA: Diagnosis present

## 2014-04-13 DIAGNOSIS — E876 Hypokalemia: Secondary | ICD-10-CM | POA: Diagnosis not present

## 2014-04-13 DIAGNOSIS — Z515 Encounter for palliative care: Secondary | ICD-10-CM | POA: Diagnosis not present

## 2014-04-13 DIAGNOSIS — I129 Hypertensive chronic kidney disease with stage 1 through stage 4 chronic kidney disease, or unspecified chronic kidney disease: Secondary | ICD-10-CM | POA: Diagnosis present

## 2014-04-13 DIAGNOSIS — N289 Disorder of kidney and ureter, unspecified: Secondary | ICD-10-CM

## 2014-04-13 DIAGNOSIS — R001 Bradycardia, unspecified: Secondary | ICD-10-CM | POA: Diagnosis present

## 2014-04-13 DIAGNOSIS — J9601 Acute respiratory failure with hypoxia: Secondary | ICD-10-CM

## 2014-04-13 DIAGNOSIS — I5042 Chronic combined systolic (congestive) and diastolic (congestive) heart failure: Secondary | ICD-10-CM | POA: Diagnosis present

## 2014-04-13 DIAGNOSIS — Z96643 Presence of artificial hip joint, bilateral: Secondary | ICD-10-CM | POA: Diagnosis present

## 2014-04-13 DIAGNOSIS — N183 Chronic kidney disease, stage 3 (moderate): Secondary | ICD-10-CM | POA: Diagnosis present

## 2014-04-13 DIAGNOSIS — I10 Essential (primary) hypertension: Secondary | ICD-10-CM | POA: Diagnosis present

## 2014-04-13 DIAGNOSIS — Z7901 Long term (current) use of anticoagulants: Secondary | ICD-10-CM

## 2014-04-13 DIAGNOSIS — G459 Transient cerebral ischemic attack, unspecified: Secondary | ICD-10-CM | POA: Diagnosis present

## 2014-04-13 DIAGNOSIS — M1990 Unspecified osteoarthritis, unspecified site: Secondary | ICD-10-CM | POA: Diagnosis present

## 2014-04-13 DIAGNOSIS — E875 Hyperkalemia: Secondary | ICD-10-CM | POA: Diagnosis present

## 2014-04-13 DIAGNOSIS — I442 Atrioventricular block, complete: Secondary | ICD-10-CM | POA: Diagnosis present

## 2014-04-13 DIAGNOSIS — J45909 Unspecified asthma, uncomplicated: Secondary | ICD-10-CM | POA: Diagnosis present

## 2014-04-13 DIAGNOSIS — Z7982 Long term (current) use of aspirin: Secondary | ICD-10-CM

## 2014-04-13 DIAGNOSIS — N179 Acute kidney failure, unspecified: Secondary | ICD-10-CM | POA: Diagnosis present

## 2014-04-13 DIAGNOSIS — Z91013 Allergy to seafood: Secondary | ICD-10-CM | POA: Diagnosis not present

## 2014-04-13 DIAGNOSIS — Z8673 Personal history of transient ischemic attack (TIA), and cerebral infarction without residual deficits: Secondary | ICD-10-CM | POA: Diagnosis not present

## 2014-04-13 HISTORY — DX: Chronic kidney disease, stage 3 unspecified: N18.30

## 2014-04-13 HISTORY — DX: Hypokalemia: E87.6

## 2014-04-13 HISTORY — DX: Unspecified asthma, uncomplicated: J45.909

## 2014-04-13 HISTORY — DX: Chronic combined systolic (congestive) and diastolic (congestive) heart failure: I50.42

## 2014-04-13 HISTORY — DX: Transient cerebral ischemic attack, unspecified: G45.9

## 2014-04-13 HISTORY — DX: Chronic kidney disease, stage 3 (moderate): N18.3

## 2014-04-13 HISTORY — DX: Chronic atrial fibrillation, unspecified: I48.20

## 2014-04-13 LAB — CBC WITH DIFFERENTIAL/PLATELET
BASOS PCT: 0 % (ref 0–1)
Basophils Absolute: 0 10*3/uL (ref 0.0–0.1)
EOS PCT: 0 % (ref 0–5)
Eosinophils Absolute: 0 10*3/uL (ref 0.0–0.7)
HEMATOCRIT: 44.5 % (ref 36.0–46.0)
HEMOGLOBIN: 14.1 g/dL (ref 12.0–15.0)
Lymphocytes Relative: 17 % (ref 12–46)
Lymphs Abs: 2 10*3/uL (ref 0.7–4.0)
MCH: 29.8 pg (ref 26.0–34.0)
MCHC: 31.7 g/dL (ref 30.0–36.0)
MCV: 94.1 fL (ref 78.0–100.0)
MONOS PCT: 5 % (ref 3–12)
Monocytes Absolute: 0.6 10*3/uL (ref 0.1–1.0)
NEUTROS PCT: 78 % — AB (ref 43–77)
Neutro Abs: 9.3 10*3/uL — ABNORMAL HIGH (ref 1.7–7.7)
Platelets: 229 10*3/uL (ref 150–400)
RBC: 4.73 MIL/uL (ref 3.87–5.11)
RDW: 13.7 % (ref 11.5–15.5)
WBC: 12 10*3/uL — ABNORMAL HIGH (ref 4.0–10.5)

## 2014-04-13 LAB — BASIC METABOLIC PANEL
ANION GAP: 11 (ref 5–15)
Anion gap: 8 (ref 5–15)
BUN: 29 mg/dL — AB (ref 6–23)
BUN: 27 mg/dL — ABNORMAL HIGH (ref 6–23)
CALCIUM: 10.7 mg/dL — AB (ref 8.4–10.5)
CHLORIDE: 106 mmol/L (ref 96–112)
CO2: 26 mmol/L (ref 19–32)
CO2: 32 mmol/L (ref 19–32)
Calcium: 11.2 mg/dL — ABNORMAL HIGH (ref 8.4–10.5)
Chloride: 103 mmol/L (ref 96–112)
Creatinine, Ser: 1.71 mg/dL — ABNORMAL HIGH (ref 0.50–1.10)
Creatinine, Ser: 1.75 mg/dL — ABNORMAL HIGH (ref 0.50–1.10)
GFR calc Af Amer: 28 mL/min — ABNORMAL LOW (ref >90)
GFR calc non Af Amer: 24 mL/min — ABNORMAL LOW (ref >90)
GFR, EST AFRICAN AMERICAN: 29 mL/min — AB (ref >90)
GFR, EST NON AFRICAN AMERICAN: 25 mL/min — AB (ref >90)
Glucose, Bld: 111 mg/dL — ABNORMAL HIGH (ref 70–99)
Glucose, Bld: 140 mg/dL — ABNORMAL HIGH (ref 70–99)
POTASSIUM: 4.4 mmol/L (ref 3.5–5.1)
Potassium: 5.6 mmol/L — ABNORMAL HIGH (ref 3.5–5.1)
SODIUM: 143 mmol/L (ref 135–145)
Sodium: 143 mmol/L (ref 135–145)

## 2014-04-13 LAB — I-STAT CHEM 8, ED
BUN: 30 mg/dL — ABNORMAL HIGH (ref 6–23)
BUN: 33 mg/dL — ABNORMAL HIGH (ref 6–23)
CALCIUM ION: 1.21 mmol/L (ref 1.13–1.30)
CHLORIDE: 105 mmol/L (ref 96–112)
Calcium, Ion: 1.52 mmol/L — ABNORMAL HIGH (ref 1.13–1.30)
Chloride: 104 mmol/L (ref 96–112)
Creatinine, Ser: 1.6 mg/dL — ABNORMAL HIGH (ref 0.50–1.10)
Creatinine, Ser: 1.7 mg/dL — ABNORMAL HIGH (ref 0.50–1.10)
GLUCOSE: 166 mg/dL — AB (ref 70–99)
Glucose, Bld: 200 mg/dL — ABNORMAL HIGH (ref 70–99)
HCT: 40 % (ref 36.0–46.0)
HEMATOCRIT: 47 % — AB (ref 36.0–46.0)
Hemoglobin: 13.6 g/dL (ref 12.0–15.0)
Hemoglobin: 16 g/dL — ABNORMAL HIGH (ref 12.0–15.0)
Potassium: 6.3 mmol/L (ref 3.5–5.1)
Potassium: 7 mmol/L (ref 3.5–5.1)
Sodium: 139 mmol/L (ref 135–145)
Sodium: 142 mmol/L (ref 135–145)
TCO2: 23 mmol/L (ref 0–100)
TCO2: 26 mmol/L (ref 0–100)

## 2014-04-13 LAB — I-STAT ARTERIAL BLOOD GAS, ED
Acid-Base Excess: 5 mmol/L — ABNORMAL HIGH (ref 0.0–2.0)
Bicarbonate: 28.3 mEq/L — ABNORMAL HIGH (ref 20.0–24.0)
O2 SAT: 100 %
PCO2 ART: 35.1 mmHg (ref 35.0–45.0)
PO2 ART: 149 mmHg — AB (ref 80.0–100.0)
Patient temperature: 98.6
TCO2: 29 mmol/L (ref 0–100)
pH, Arterial: 7.514 — ABNORMAL HIGH (ref 7.350–7.450)

## 2014-04-13 LAB — URINALYSIS, ROUTINE W REFLEX MICROSCOPIC
Bilirubin Urine: NEGATIVE
Glucose, UA: NEGATIVE mg/dL
Hgb urine dipstick: NEGATIVE
KETONES UR: NEGATIVE mg/dL
Leukocytes, UA: NEGATIVE
NITRITE: NEGATIVE
Protein, ur: NEGATIVE mg/dL
Specific Gravity, Urine: 1.01 (ref 1.005–1.030)
UROBILINOGEN UA: 0.2 mg/dL (ref 0.0–1.0)
pH: 7.5 (ref 5.0–8.0)

## 2014-04-13 LAB — CREATININE, SERUM
Creatinine, Ser: 1.71 mg/dL — ABNORMAL HIGH (ref 0.50–1.10)
GFR calc Af Amer: 29 mL/min — ABNORMAL LOW (ref >90)
GFR calc non Af Amer: 25 mL/min — ABNORMAL LOW (ref >90)

## 2014-04-13 LAB — I-STAT TROPONIN, ED: Troponin i, poc: 0.02 ng/mL (ref 0.00–0.08)

## 2014-04-13 LAB — CBC
HCT: 44.2 % (ref 36.0–46.0)
HEMOGLOBIN: 14.3 g/dL (ref 12.0–15.0)
MCH: 30.4 pg (ref 26.0–34.0)
MCHC: 32.4 g/dL (ref 30.0–36.0)
MCV: 94 fL (ref 78.0–100.0)
PLATELETS: 229 10*3/uL (ref 150–400)
RBC: 4.7 MIL/uL (ref 3.87–5.11)
RDW: 13.6 % (ref 11.5–15.5)
WBC: 12.4 10*3/uL — ABNORMAL HIGH (ref 4.0–10.5)

## 2014-04-13 LAB — TROPONIN I
TROPONIN I: 0.06 ng/mL — AB (ref <0.031)
Troponin I: 0.05 ng/mL — ABNORMAL HIGH (ref <0.031)

## 2014-04-13 LAB — POTASSIUM: Potassium: 6.6 mmol/L (ref 3.5–5.1)

## 2014-04-13 LAB — PHOSPHORUS: PHOSPHORUS: 2.8 mg/dL (ref 2.3–4.6)

## 2014-04-13 LAB — MAGNESIUM: Magnesium: 2.3 mg/dL (ref 1.5–2.5)

## 2014-04-13 LAB — PROTIME-INR
INR: 1.46 (ref 0.00–1.49)
Prothrombin Time: 17.9 seconds — ABNORMAL HIGH (ref 11.6–15.2)

## 2014-04-13 LAB — LACTIC ACID, PLASMA: Lactic Acid, Venous: 2.9 mmol/L (ref 0.5–2.0)

## 2014-04-13 LAB — MRSA PCR SCREENING: MRSA BY PCR: NEGATIVE

## 2014-04-13 MED ORDER — CHLORHEXIDINE GLUCONATE 0.12 % MT SOLN
15.0000 mL | Freq: Two times a day (BID) | OROMUCOSAL | Status: DC
  Administered 2014-04-13 – 2014-04-14 (×2): 15 mL via OROMUCOSAL
  Filled 2014-04-13 (×2): qty 15

## 2014-04-13 MED ORDER — FENTANYL CITRATE 0.05 MG/ML IJ SOLN
INTRAMUSCULAR | Status: AC
  Filled 2014-04-13: qty 2

## 2014-04-13 MED ORDER — DEXTROSE 50 % IV SOLN
1.0000 | Freq: Once | INTRAVENOUS | Status: AC
  Administered 2014-04-13: 50 mL via INTRAVENOUS
  Filled 2014-04-13: qty 50

## 2014-04-13 MED ORDER — SUCCINYLCHOLINE CHLORIDE 20 MG/ML IJ SOLN
INTRAMUSCULAR | Status: AC
  Filled 2014-04-13: qty 1

## 2014-04-13 MED ORDER — SODIUM BICARBONATE 8.4 % IV SOLN
INTRAVENOUS | Status: DC | PRN
  Administered 2014-04-13: 50 meq via INTRAVENOUS

## 2014-04-13 MED ORDER — FLUMAZENIL 0.5 MG/5ML IV SOLN
INTRAVENOUS | Status: DC | PRN
  Administered 2014-04-13: 50 mg via INTRAVENOUS

## 2014-04-13 MED ORDER — SODIUM POLYSTYRENE SULFONATE 15 GM/60ML PO SUSP
30.0000 g | Freq: Once | ORAL | Status: AC
  Administered 2014-04-13: 30 g via ORAL
  Filled 2014-04-13: qty 120

## 2014-04-13 MED ORDER — CALCIUM CHLORIDE 10 % IV SOLN
INTRAVENOUS | Status: DC | PRN
  Administered 2014-04-13: 1 g via INTRAVENOUS

## 2014-04-13 MED ORDER — INSULIN ASPART 100 UNIT/ML IV SOLN
10.0000 [IU] | Freq: Once | INTRAVENOUS | Status: AC
  Administered 2014-04-13: 10 [IU] via INTRAVENOUS
  Filled 2014-04-13: qty 1

## 2014-04-13 MED ORDER — FENTANYL CITRATE 0.05 MG/ML IJ SOLN: 50.0000 ug | Freq: Once | INTRAMUSCULAR | Status: DC

## 2014-04-13 MED ORDER — MIDAZOLAM HCL 2 MG/2ML IJ SOLN
INTRAMUSCULAR | Status: AC
  Filled 2014-04-13: qty 4

## 2014-04-13 MED ORDER — FENTANYL BOLUS VIA INFUSION
25.0000 ug | INTRAVENOUS | Status: DC | PRN
  Filled 2014-04-13: qty 25

## 2014-04-13 MED ORDER — ETOMIDATE 2 MG/ML IV SOLN
INTRAVENOUS | Status: AC
  Filled 2014-04-13: qty 20

## 2014-04-13 MED ORDER — DEXTROSE 5 % IV SOLN
3.0000 mg/h | INTRAVENOUS | Status: DC
  Filled 2014-04-13: qty 5

## 2014-04-13 MED ORDER — LIDOCAINE HCL (CARDIAC) 20 MG/ML IV SOLN
INTRAVENOUS | Status: AC
  Filled 2014-04-13: qty 5

## 2014-04-13 MED ORDER — GLUCAGON HCL RDNA (DIAGNOSTIC) 1 MG IJ SOLR
5.0000 mg | Freq: Once | INTRAMUSCULAR | Status: AC
  Administered 2014-04-13: 5 mg via INTRAVENOUS
  Filled 2014-04-13: qty 5

## 2014-04-13 MED ORDER — MIDAZOLAM HCL 5 MG/5ML IJ SOLN
INTRAMUSCULAR | Status: DC | PRN
  Administered 2014-04-13: 2 mg via INTRAVENOUS

## 2014-04-13 MED ORDER — ATROPINE SULFATE 1 MG/ML IJ SOLN: 1.0000 mg | Freq: Once | INTRAMUSCULAR | Status: AC

## 2014-04-13 MED ORDER — MIDAZOLAM HCL 2 MG/2ML IJ SOLN: 2.0000 mg | Freq: Once | INTRAMUSCULAR | Status: AC

## 2014-04-13 MED ORDER — CETYLPYRIDINIUM CHLORIDE 0.05 % MT LIQD
7.0000 mL | Freq: Four times a day (QID) | OROMUCOSAL | Status: DC
  Administered 2014-04-14 (×4): 7 mL via OROMUCOSAL

## 2014-04-13 MED ORDER — MIDAZOLAM HCL 2 MG/2ML IJ SOLN
1.0000 mg | INTRAMUSCULAR | Status: DC | PRN
  Administered 2014-04-13 (×2): 1 mg via INTRAVENOUS
  Filled 2014-04-13 (×2): qty 2

## 2014-04-13 MED ORDER — PROPOFOL 10 MG/ML IV EMUL
5.0000 ug/kg/min | Freq: Once | INTRAVENOUS | Status: DC
  Administered 2014-04-13: 20 ug/kg/min via INTRAVENOUS

## 2014-04-13 MED ORDER — ROCURONIUM BROMIDE 50 MG/5ML IV SOLN
INTRAVENOUS | Status: AC
  Filled 2014-04-13: qty 2

## 2014-04-13 MED ORDER — HEPARIN SODIUM (PORCINE) 5000 UNIT/ML IJ SOLN
5000.0000 [IU] | Freq: Three times a day (TID) | INTRAMUSCULAR | Status: DC
  Administered 2014-04-14: 5000 [IU] via SUBCUTANEOUS
  Filled 2014-04-13 (×3): qty 1

## 2014-04-13 MED ORDER — SODIUM CHLORIDE 0.9 % IV SOLN
INTRAVENOUS | Status: DC
  Administered 2014-04-13 – 2014-04-15 (×3): via INTRAVENOUS

## 2014-04-13 MED ORDER — PANTOPRAZOLE SODIUM 40 MG IV SOLR
40.0000 mg | INTRAVENOUS | Status: DC
  Administered 2014-04-13: 40 mg via INTRAVENOUS
  Filled 2014-04-13 (×2): qty 40

## 2014-04-13 MED ORDER — SODIUM BICARBONATE 8.4 % IV SOLN
50.0000 meq | Freq: Once | INTRAVENOUS | Status: AC
  Administered 2014-04-13: 50 meq via INTRAVENOUS
  Filled 2014-04-13: qty 50

## 2014-04-13 MED ORDER — SODIUM CHLORIDE 0.9 % IV SOLN
25.0000 ug/h | INTRAVENOUS | Status: DC
  Administered 2014-04-13: 50 ug/h via INTRAVENOUS
  Filled 2014-04-13: qty 50

## 2014-04-13 MED ORDER — SODIUM CHLORIDE 0.9 % IV SOLN: 250.0000 mL | INTRAVENOUS | Status: DC | PRN

## 2014-04-13 MED ORDER — FENTANYL CITRATE 0.05 MG/ML IJ SOLN
INTRAMUSCULAR | Status: DC | PRN
  Administered 2014-04-13: 25 ug via INTRAVENOUS

## 2014-04-13 MED ORDER — FENTANYL CITRATE 0.05 MG/ML IJ SOLN
25.0000 ug | INTRAMUSCULAR | Status: DC | PRN
  Administered 2014-04-13: 100 ug via INTRAVENOUS
  Filled 2014-04-13: qty 2

## 2014-04-13 MED ORDER — PROPOFOL 10 MG/ML IV EMUL
INTRAVENOUS | Status: AC
  Filled 2014-04-13: qty 100

## 2014-04-13 MED ORDER — HYDRALAZINE HCL 20 MG/ML IJ SOLN
10.0000 mg | INTRAMUSCULAR | Status: DC | PRN
  Administered 2014-04-14: 10 mg via INTRAVENOUS
  Administered 2014-04-15: 20 mg via INTRAVENOUS
  Filled 2014-04-13 (×2): qty 1

## 2014-04-13 MED ORDER — ATROPINE SULFATE 1 MG/ML IJ SOLN
INTRAMUSCULAR | Status: DC | PRN
  Administered 2014-04-13: 1 mg via INTRAVENOUS

## 2014-04-13 MED ORDER — MIDAZOLAM HCL 2 MG/2ML IJ SOLN: 1.0000 mg | INTRAMUSCULAR | Status: DC | PRN

## 2014-04-13 NOTE — Progress Notes (Signed)
    Turned external pacer off HR 60 currently AFIB K now 5  Will keep zoll at bedside.   If needed again - captures at 70mA.   Donato SchultzSKAINS, MARK, MD

## 2014-04-13 NOTE — Progress Notes (Signed)
Called Dr Sung AmabileSimonds about Lactic acid of 2.9.  MD aware now, no new orders rec'd.   Eliane DecreeSmith, Kegan Mckeithan Moore, RN

## 2014-04-13 NOTE — Code Documentation (Signed)
Pt becoming more unresponsive, pt no longer protecting airway. Dr. Hyacinth MeekerMiller said to prepare for intubation.

## 2014-04-13 NOTE — ED Notes (Signed)
Per EMS, pt called out for generalized weakness. Pt went to stand out of a chair and had a syncopal episode with fall. No injuries reported. Pt received 324 of aspirin in route. Pt denies all pain and dizziness. Pts initial HR for EMS was 18 and went to 24 after .5 of atropine.

## 2014-04-13 NOTE — ED Notes (Signed)
Pt. transported from ED Trauma-B to 2H06 uneventfully, RT covering unit made aware, RT to monitor,.

## 2014-04-13 NOTE — ED Notes (Signed)
Family at the bedside.

## 2014-04-13 NOTE — ED Notes (Signed)
Cardiologist says to continue pacing.

## 2014-04-13 NOTE — H&P (Signed)
PULMONARY / CRITICAL CARE MEDICINE   Name: [name] MRN: [id number] DOB: 06/22/1918    ADMISSION DATE:  04/13/2014 CONSULTATION DATE:  04/13/2014  REFERRING MD :  Antonieta Loveless  CHIEF COMPLAINT:  Bradycardia  INITIAL PRESENTATION: 81 year old female who presented to ED 3/8 after syncopal episode at home. In ED she was found to be bradycardic (20) in CHB. Intubated for airway protection and transcutaneously paced in ED. Hyperkalemic, treated in ED. PCCM to admit.   STUDIES:    SIGNIFICANT EVENTS: 3/8 - syncopal, Complete heart block, intubated, to ICU.  HISTORY OF PRESENT ILLNESS:  81 year old female with PMH as below, which includes Afib, systolic/diastolic CHF, Asthma, hypokalemia, and CKD stage 3. She had been experiencing generalized weakness when she had a syncopal episode at home 3/8. Was awake and alert for EMS and at arrival to ED. She was profoundly bradycardic with HR in low 20s and was found to be in complete heart block. Her mental status began to deteriorate and she was intubated for airway protection, transcutaneous pacing was initiated. She was found to be hyperkalemic and was given calcium, bicarb, kayexalate. PCCM to admit.   PAST MEDICAL HISTORY :   has a past medical history of Hypertension; Chronic atrial fibrillation; Arthritis; Chronic combined systolic and diastolic CHF (congestive heart failure); Asthma; Hypokalemia; TIA (transient ischemic attack); and CKD (chronic kidney disease), stage III.  has past surgical history that includes Other surgical history and Joint replacement. Prior to Admission medications   Medication Sig Start Date End Date Taking? Authorizing Provider  ALPRAZolam Prudy Feeler) 0.25 MG tablet Take 0.25 mg by mouth daily as needed for anxiety.   Yes Historical Provider, MD  aspirin 81 MG tablet Take 81 mg by mouth every morning.   Yes Historical Provider, MD  calcium-vitamin D (OSCAL WITH D) 500-200 MG-UNIT per tablet Take 1 tablet by mouth 2 (two)  times daily.   Yes Historical Provider, MD  diltiazem (CARDIZEM CD) 240 MG 24 hr capsule Take 240 mg by mouth daily.   Yes Historical Provider, MD  furosemide (LASIX) 40 MG tablet Take 40 mg by mouth 2 (two) times daily.   Yes Historical Provider, MD  HYDROcodone-acetaminophen (VICODIN) 5-500 MG per tablet Take 1 tablet by mouth every 6 (six) hours as needed for pain.   Yes Historical Provider, MD  magnesium oxide (MAG-OX) 400 MG tablet Take 400 mg by mouth 2 (two) times daily.   Yes Historical Provider, MD  metoprolol tartrate (LOPRESSOR) 25 MG tablet Take 25 mg by mouth 2 (two) times daily.   Yes Historical Provider, MD  potassium chloride SA (K-DUR,KLOR-CON) 20 MEQ tablet Take 20 mEq by mouth 2 (two) times daily.   Yes Historical Provider, MD  tiotropium (SPIRIVA) 18 MCG inhalation capsule Place 18 mcg into inhaler and inhale daily as needed (shortness of breath, wheezing).   Yes Historical Provider, MD  warfarin (COUMADIN) 2 MG tablet Take 2 mg by mouth daily at 6 PM.   Yes Historical Provider, MD   Allergies  Allergen Reactions  . Shrimp [Shellfish Allergy] Swelling    FAMILY HISTORY:  has no family status information on file.  SOCIAL HISTORY:  reports that she has never smoked. She does not have any smokeless tobacco history on file. She reports that she does not drink alcohol or use illicit drugs.  REVIEW OF SYSTEMS:  Unable, intubated  SUBJECTIVE:   VITAL SIGNS: Temp:  [96.4 F (35.8 C)] 96.4 F (35.8 C) (03/08 1350)  Pulse Rate:  [24-73] 36 (03/08 1445) Resp:  [10-22] 20 (03/08 1445) BP: (90-162)/(51-93) 162/73 mmHg (03/08 1445) SpO2:  [97 %-100 %] 100 % (03/08 1445) FiO2 (%):  [60 %] 60 % (03/08 1410) Weight:  [65 kg (143 lb 4.8 oz)] 65 kg (143 lb 4.8 oz) (03/08 1442) HEMODYNAMICS:   VENTILATOR SETTINGS: Vent Mode:  [-] PRVC FiO2 (%):  [60 %] 60 % Set Rate:  [16 bmp] 16 bmp Vt Set:  [500 mL] 500 mL PEEP:  [5 cmH20] 5 cmH20 Plateau Pressure:  [20 cmH20] 20  cmH20 INTAKE / OUTPUT: No intake or output data in the 24 hours ending 04/13/14 1550  PHYSICAL EXAMINATION: General:  Thin elderly female on vent Neuro:  sedated HEENT:  [place]/AT, ETT in place, no JVD noted Cardiovascular:  Huston FoleyBrady, regular, pacer pads on, but not capturing.  Lungs:  Clear bilateral breath sounds. Abdomen:  Soft, non-distended, BS intact Musculoskeletal:  No acute deformity Skin:  Grossly intact  LABS:  CBC  Recent Labs Lab 04/13/14 1353 04/13/14 1413  HGB 16.0* 13.6  HCT 47.0* 40.0   Coag's  Recent Labs Lab 04/13/14 1346  INR 1.46   BMET  Recent Labs Lab 04/13/14 1353 04/13/14 1405 04/13/14 1413  NA 139  --  142  K 7.0* 6.6* 6.3*  CL 104  --  105  BUN 33*  --  30*  CREATININE 1.70*  --  1.60*  GLUCOSE 200*  --  166*   Electrolytes No results for input(s): CALCIUM, MG, PHOS in the last 168 hours. Sepsis Markers No results for input(s): LATICACIDVEN, PROCALCITON, O2SATVEN in the last 168 hours. ABG  Recent Labs Lab 04/13/14 1522  PHART 7.514*  PCO2ART 35.1  PO2ART 149.0*   Liver Enzymes No results for input(s): AST, ALT, ALKPHOS, BILITOT, ALBUMIN in the last 168 hours. Cardiac Enzymes No results for input(s): TROPONINI, PROBNP in the last 168 hours. Glucose No results for input(s): GLUCAP in the last 168 hours.  Imaging No results found.   ASSESSMENT / PLAN:  PULMONARY OETT 3/8 >>> A: Acute respiratory failure in setting of encephalopathy  P:   Full vent support ABG Follow CXR VAP bundle  CARDIOVASCULAR A:  Complete heart block - second to hyperkalemia, ? acidosis Chronic systolic/diastolic CHF (2013 - LVEF 45-50%) Chronic atrial fibrillation Hypertension ? BB/CCB unintentional overdose?  P:  Telemetry monitoring Transcutaneous pacing, hope can stop once metabolic abnormalities corrected. Glucagon 5 mg bolus Hold home antihypertensives (dilt, lopressor, metoprolol) PRN hydralazine Cardiology  following  RENAL A:   AKI (baseline creat 1.2) Hyperkalemia (calcium, bicarb given in ED)  P:   Bicarb amp x 1 Insulin 10 units and dextrose amp Kayexalate Bmet q 4 hours Gentle hydration Holding home lasix, K supplementation.  GASTROINTESTINAL A:   No acute issues  P:   SUP: IV pantoprazole NPO for now  HEMATOLOGIC A:   Chronic anticoagulation with Eliquis for AF   P:  Follow CBC Check coags Hold eliquis  INFECTIOUS A:   No acute issues  P:   Monitor off ABX  ENDOCRINE A:   No acute issues   P:   Follow glucose on chemistry Add SSI if consistently over 180  NEUROLOGIC A:   Acute metabolic encephalopathy  P:   RASS goal: -1 PRN versed for sedation Fentanyl gtt for analgesia Monitor   FAMILY  - Updates:   - Inter-disciplinary family meet or Palliative Care meeting due by:  3/15   Joneen RoachPaul Hoffman, AGACNP-BC [facility]  [facility] Pager [phone] or [phone]   Reviewed above, examined.  81 yo female was in usual state of health until this AM.  She developed weakness and sat on chair.  She tried standing up, but then passed out.  She was noted to have HR < 20 by EMS.  In ER she was bradycardic and started on external pacing.  She was given versed and fentanyl, and then became lethargic.  She was intubated for airway protection.  She was found to have hyperkalemia >> given HCO3, Calcium.  She was seen by cardiology.  Family reports she takes lopressors, cardizem, potassium, eliquis as outpt meds.  She is sedated, ETT in place, heart rate regular and slow, lungs clear, abd soft, no edema.  Continue vent support.  External pacing per cardiology.  Give additional HCO3, Calcium.  Will give glucagon.  F/u K.  Discussed with pt's daughter and granddaughter.  Discussed goals of care >> they report she has advanced directive, but they are not certain what is stated in this.  They will confer with remainder of family.  Discussed plan  with Dr. Anne Fu with cardiology.  CC time by me independent of APP time is 45 minutes.  Coralyn Helling, MD [facility] Pulmonary/Critical Care 04/13/2014, 4:16 PM Pager:  [phone] After 3pm call: [phone]

## 2014-04-13 NOTE — ED Notes (Signed)
Intensivist at the bedside

## 2014-04-13 NOTE — ED Notes (Signed)
Dr. Hyacinth MeekerMiller started external pacing pacing at 70bpm on 36mA

## 2014-04-13 NOTE — ED Provider Notes (Signed)
CSN: [id number]     Arrival date & time 04/13/14  1332 History   First MD Initiated Contact with Patient 04/13/14 1336     Chief Complaint  Patient presents with  . Bradycardia     (Consider location/radiation/quality/duration/timing/severity/associated sxs/prior Treatment) HPI Comments: The patient is a 81 year old female with a history of hypertension, atrial fibrillation. She present by notes after feeling acute onset of generalized weakness. She states that she had a syncopal event when she went to stand up out of a chair, she had received 325 of aspirin in route.  The patient has no history of similar findings, denies any new medications, states she has not had fevers diarrhea or vomiting. She says her symptoms are severe. The patient is weak appearing, it is difficult to obtain history secondary to her somnolence.  The history is provided by the patient, the EMS personnel and medical records.    Past Medical History  Diagnosis Date  . Hypertension   . Chronic atrial fibrillation   . Arthritis   . Chronic combined systolic and diastolic CHF (congestive heart failure)     a. Dx 2013, EF 45-50%, felt due in part to rapid AF.  Marland Kitchen Asthma   . Hypokalemia   . TIA (transient ischemic attack)     a. Question TIA during 2013 admission with word finding difficulty, already on Coumadin at the time.  . CKD (chronic kidney disease), stage III     a. Per review of chart, likely CKD stage III in 2013.Marland Kitchen   Past Surgical History  Procedure Laterality Date  . Other surgical history      bilateral hip replacement  . Joint replacement      bilateral hip replacement   Family History  Problem Relation Age of Onset  . Heart failure Mother    History  Substance Use Topics  . Smoking status: Never Smoker   . Smokeless tobacco: Not on file  . Alcohol Use: No   OB History    No data available     Review of Systems  Unable to perform ROS: Acuity of condition      Allergies   Shrimp  Home Medications   Prior to Admission medications   Medication Sig Start Date End Date Taking? Authorizing Provider  tiotropium (SPIRIVA) 18 MCG inhalation capsule Place 18 mcg into inhaler and inhale daily as needed (shortness of breath, wheezing).   Yes Historical Provider, MD  calcium carbonate (OS-CAL - DOSED IN MG OF ELEMENTAL CALCIUM) 1250 MG tablet Take 1 tablet by mouth daily. 03/23/11 03/22/12  Edsel Petrin, DO  diltiazem (CARDIZEM CD) 240 MG 24 hr capsule Take 240 mg by mouth daily. 03/23/11 03/22/12  Edsel Petrin, DO  furosemide (LASIX) 40 MG tablet Take 40 mg by mouth 2 (two) times daily. 03/23/11 03/22/12  Edsel Petrin, DO  metoprolol tartrate (LOPRESSOR) 25 MG tablet Take 25 mg by mouth 2 (two) times daily. 03/23/11 03/22/12  Edsel Petrin, DO  potassium chloride SA (K-DUR,KLOR-CON) 20 MEQ tablet Take 40 mEq by mouth daily. 03/23/11 03/22/12  Edsel Petrin, DO  PREDNISOLONE ACETATE OP Place 1 drop into the right eye as directed.    Historical Provider, MD  warfarin (COUMADIN) 2 MG tablet Take 2 mg by mouth daily. 03/23/11 03/22/12  Edsel Petrin, DO   BP 100/60 mmHg  Pulse 24  Resp 10  SpO2 100% Physical Exam  Constitutional: She appears well-developed and well-nourished. She appears distressed.  HENT:  Head: Normocephalic and atraumatic.  Mouth/Throat: Oropharynx is clear and moist. No oropharyngeal exudate.  Eyes: Conjunctivae and EOM are normal. Pupils are equal, round, and reactive to light. Right eye exhibits no discharge. Left eye exhibits no discharge. No scleral icterus.  Neck: Normal range of motion. Neck supple. No JVD present. No thyromegaly present.  Cardiovascular: Regular rhythm, normal heart sounds and intact distal pulses.  Exam reveals no gallop and no friction rub.   No murmur heard. Extreme bradycardia, weak pulses  Pulmonary/Chest: Effort normal and breath sounds normal. No respiratory distress. She has no wheezes.  She has no rales.  Abdominal: Soft. Bowel sounds are normal. She exhibits no distension and no mass. There is no tenderness.  Musculoskeletal: Normal range of motion. She exhibits no edema or tenderness.  Lymphadenopathy:    She has no cervical adenopathy.  Neurological:  Somnolent, difficult to arouse, follows commands when aroused  Skin: Skin is warm. No rash noted. She is diaphoretic. No erythema.  Psychiatric: She has a normal mood and affect. Her behavior is normal.  Nursing note and vitals reviewed.   ED Course  Procedures (including critical care time) Labs Review Labs Reviewed  I-STAT CHEM 8, ED - Abnormal; Notable for the following:    Potassium 7.0 (*)    BUN 33 (*)    Creatinine, Ser 1.70 (*)    Glucose, Bld 200 (*)    Hemoglobin 16.0 (*)    HCT 47.0 (*)    All other components within normal limits  I-STAT CHEM 8, ED - Abnormal; Notable for the following:    Potassium 6.3 (*)    BUN 30 (*)    Creatinine, Ser 1.60 (*)    Glucose, Bld 166 (*)    Calcium, Ion 1.52 (*)    All other components within normal limits  PROTIME-INR  CBC WITH DIFFERENTIAL/PLATELET  POTASSIUM  I-STAT TROPOININ, ED    Imaging Review Dg Chest Portable 1 View  04/13/2014   CLINICAL DATA:  Bradycardia  EXAM: PORTABLE CHEST - 1 VIEW  COMPARISON:  03/19/2011  FINDINGS: Cardiac shadow remains mildly enlarged. The lungs are well aerated bilaterally. No focal infiltrate or sizable effusion is seen. No acute bony abnormality is seen.  IMPRESSION: No acute abnormality noted.   Electronically Signed   By: Alcide CleverMark  Lukens M.D.   On: 04/13/2014 14:02     EKG Interpretation   Date/Time:  Tuesday April 13 2014 13:34:12 EST Ventricular Rate:  19 PR Interval:  220 QRS Duration: 91 QT Interval:  583 QTC Calculation: 328 R Axis:   105 Text Interpretation:  with complete heart block aflutter Abnormal ekg  Since last tracing bradycardia now present Confirmed by Jency Schnieders  MD, Margo Lama  [id number]) on 04/13/2014  2:16:31 PM      MDM   Final diagnoses:  Complete heart block  Hyperkalemia  Renal insufficiency    The patient is acutely ill with what appears to be complete heart block. She has a pulse rate of approximately 20, weak pulses, hypotensive, waning mental status. Because of her hypotension and ill appearance we elected to use transcutaneous pacing, with this the patient was extubated with uncomfortable and was given a very small amount of Versed and a small amount of fentanyl, 2 mg and 25 g respectively, she had a sharp decline in her mental status and required intubation for airway protection. She has a persistent hypotension, her blood work now shows that she is hyperkalemic with a potassium of 7, normal troponin, renal  function has declined as well. She is severely ill from hyperkalemic cardiac compromise, requiring transfer cutaneous pacing as well as calcium and bicarbonate given for hyperkalemia. Discussed with cardiology who is at the bedside, will discussed with critical care for ventilation management as well as admission orders.  INTUBATION Performed by: Vida Roller  Required items: required blood products, implants, devices, and special equipment available Patient identity confirmed: provided demographic data and hospital-assigned identification number Time out: Immediately prior to procedure a "time out" was called to verify the correct patient, procedure, equipment, support staff and site/side marked as required.  Indications: 9 decreased respiratory effort   Intubation method: Direct Laryngoscopy   Preoxygenation: BVM  Sedatives: 2 mg Versed  Paralytic: 50 mg rocuronium   Tube Size: 7 cuffed  Post-procedure assessment: chest rise and ETCO2 monitor Breath sounds: equal and absent over the epigastrium Tube secured with: ETT holder Chest x-ray interpreted by radiologist and me.  Chest x-ray findings: endotracheal tube in appropriate position  Patient tolerated the  procedure well with no immediate complications.    Procedure Note:  Transcutaneous pacing Indication: Severe bradycardia and complete heart block Consent implied given severity of situation Pacer pads placed on anterior and posterior position, milliamps at 36, rate at 70, good capture   fentanyl and Versed used for sedation due to patient's significant symptoms with this.   Discussed care with cardiology, intensive care unit intensivist as well as family members, all parties are involved and aware of what is going on, the patient will be admitted to the intensive care unit.  CRITICAL CARE Performed by: Vida Roller Total critical care time: 35 Critical care time was exclusive of separately billable procedures and treating other patients. Critical care was necessary to treat or prevent imminent or life-threatening deterioration. Critical care was time spent personally by me on the following activities: development of treatment plan with patient and/or surrogate as well as nursing, discussions with consultants, evaluation of patient's response to treatment, examination of patient, obtaining history from patient or surrogate, ordering and performing treatments and interventions, ordering and review of laboratory studies, ordering and review of radiographic studies, pulse oximetry and re-evaluation of patient's condition.   Eber Hong, MD 04/13/14 1434

## 2014-04-13 NOTE — Consult Note (Signed)
Cardiology Consultation Note  Patient ID: COURTNEY BELLIZZI, MRN: [id number], DOB/AGE: July 18, 1918 81 y.o. Admit date: 04/13/2014   Date of Consult: 04/13/2014 Primary Physician: Ailene Ravel, MD Primary Cardiologist: Dr. Elsie Lincoln remotely; Dr. Rennis Golden saw in consult in 2013 but no outpatient notes since then   Chief Complaint: weakness/syncope Reason for Consultation: severe bradycardia HR 19  HPI: 81 y/o F with history of chronic atrial fibrillation, combined CHF by echo 2013 (EF 45-50%) felt due in part to rapid AF, HTN, arthritis, probable CKD stage III based on historical lab, possible TIA 2013 who presented to [facility] today with profound bradycardia and syncope in the setting of hyperkalemia. Patient is currently intubated so history obtained through chart and talking to EDP/family. She lives at home independently and was talking and awake when she got to the ER. She apparently started feeling poorly with generalized weakness around lunch time. Went to stand out of a chair and had syncope with fall, no injuries reported. EMS called.  ASA administered. Initial HR 18, went into the 20s with 0.5mg  of atropine. In the ER received  without significant HR improvement. BP 90s/60s. Externally pacing applied at 70BPM but failing to capture some of the time, so HR only about 36, but this was corrected by increasing mA to 70 while she is sedate. Now captures well.  Patient was noting significant pain with pacing thus was given low dose Versed and fentanyl. Subsequent to that patient became more apneic and unresponsive thus was intubated for airway protection. She was given Immunologist. Istat showed K 7, BUN/Cr 33/1.7, glucose 200. CXR nonacute. ER treating hyperkalemia, just given calcium chloride and sodium bicarb. Family reports she takes potassium BID, metoprolol BID, diltiazem, Lasix BID, eye drops (?possibly timolol), magnesium, iron, and was recently changed from Coumadin to  Eliquis BID.  Family arrived and confirms patient is fairly functional, lives independently at [place]. Able to ambulate down to the cafeteria for meals. No longer drives. With external pacing (even when failing to capture, HR mid 30s), BP improved from 90s->130-160s systolic. Now pacing in 70's.  Past Medical History  Diagnosis Date  . Hypertension   . Chronic atrial fibrillation   . Arthritis   . Chronic combined systolic and diastolic CHF (congestive heart failure)     a. Dx 2013, EF 45-50%, felt due in part to rapid AF.  Marland Kitchen Asthma   . Hypokalemia   . TIA (transient ischemic attack)     a. Question TIA during 2013 admission with word finding difficulty, already on Coumadin at the time.  . CKD (chronic kidney disease), stage III     a. Per review of chart, likely CKD stage III in 2013..      Most Recent Cardiac Studies: 2D Echo 03/2011 - Left ventricle: The cavity size was normal. There was moderate concentric hypertrophy. Systolic function wasmildly reduced. The estimated ejection fraction was in the range of 45% to 50%. LV diastolic function cannot beassessed due to atrial fibrillation. The E/e' ratio, however, is >15, suggesting elevated LV filling pressure. - Aortic valve: Mildly calcified, with possibly mildstenosis. Transvalvular velocity was minimally increased.Mild regurgitation. Mean gradient: 10mm Hg (S). Peakgradient: 19mm Hg (S). - Mitral valve: Mildly thickened leaflets . Trivialregurgitation. - Left atrium: Severely dilated. - Right atrium: The atrium was mildly dilated. - Inferior vena cava: The vessel was normal in size; the respirophasic diameter changes were in the normal range (=50%); findings are consistent with normal central venous pressure.  Surgical History:  Past Surgical History  Procedure Laterality Date  . Other surgical history      bilateral hip replacement  . Joint replacement      bilateral hip replacement     Home Meds: Per  granddaughter who manages medicines: Iron Potassium 20meq BID Magnesium Metoprolol BID Lasix BID Diltiazem Eye drops (?timolol) Eliquis BID   Inpatient Medications:  . etomidate      . fentaNYL      . lidocaine (cardiac) 100 mg/805ml      . midazolam      . rocuronium      . succinylcholine       . propofol      Allergies:  Allergies  Allergen Reactions  . Shrimp [Shellfish Allergy] Swelling    History   Social History  . Marital Status: Widowed    Spouse Name: N/A  . Number of Children: N/A  . Years of Education: N/A   Occupational History  . Not on file.   Social History Main Topics  . Smoking status: Never Smoker   . Smokeless tobacco: Not on file  . Alcohol Use: No  . Drug Use: No  . Sexual Activity: Not on file   Other Topics Concern  . Not on file   Social History Narrative     Family History  Problem Relation Age of Onset  . Heart failure Mother      Review of Systems: unable to obtain due to sedated, intubated.  Labs:  Lab Results  Component Value Date   HGB 13.6 04/13/2014   HCT 40.0 04/13/2014    Recent Labs Lab 04/13/14 1413  NA 142  K 6.3*  CL 105  BUN 30*  CREATININE 1.60*  GLUCOSE 166*   Radiology/Studies:  Dg Chest Portable 1 View  04/13/2014   CLINICAL DATA:  Bradycardia  EXAM: PORTABLE CHEST - 1 VIEW  COMPARISON:  03/19/2011  FINDINGS: Cardiac shadow remains mildly enlarged. The lungs are well aerated bilaterally. No focal infiltrate or sizable effusion is seen. No acute bony abnormality is seen.  IMPRESSION: No acute abnormality noted.   Electronically Signed   By: Alcide CleverMark  Lukens M.D.   On: 04/13/2014 14:02    Wt Readings from Last 3 Encounters:  03/22/11 143 lb 4.8 oz (65 kg)   EKG: severe bradycardia 19bpm question complete heart block (rare P wave seen)  Physical Exam: Blood pressure 100/60, pulse 24, resp. rate 10, SpO2 100 %.  General: Well developed intubated WF in no acute distress. Head: Normocephalic,  atraumatic, sclera non-icteric, no xanthomas, nares are without discharge.  Neck: JVD not elevated. Lungs: Being manually ventilated, coarse BS throughout. Heart: RRR with S1 S2 from pacing. No murmurs, rubs, or gallops appreciated. Abdomen: Soft, non-tender, non-distended with normoactive bowel sounds. No hepatomegaly. No rebound/guarding. No obvious abdominal masses. Msk:  Strength and tone appear normal for age. Extremities: No clubbing or cyanosis. No edema.  Distal pedal pulses are 2+ and equal bilaterally. Warm. Neuro: Intubated, sedated.    Assessment and Plan:   1. Severe symptomatic bradycardia 2. Marked hyperkalemia 3. AKI on CKD stage III 4. Chronic atrial fibrillation on Eliquis 5. H/o possible TIA in 2013 6. Combined CHF diagnosed in 2013 EF 45-50%  Patient presents with syncope and severe bradycardia in the setting of potassium of 7.0, on diltiazem/metoprolol at home. Dr. Anne FuSkains has readjusted the pacer and she is now capturing. Family conference held with Dr. Anne FuSkains, Dr. Craige CottaSood, patient's daughter, and granddaughter. They confirm  patient has been able to continue living independently and had not had recent complaints up until today's event. Recommend supportive care for now with correction of electrolytes and holding of any further AVN blocking agents. Can consider low dose dopamine in place of external pacing if capturing becomes an issue again. Long discussions had about code status. The patient apparently has papers in her freezer regarding advanced directives, possibly DNR but family unsure. For now they request the patient remain full code in the short term. They will be obtaining her papers and discussing further with family members. Given use of Eliquis, advanced age, and comorbidities would hope to avoid temp wire if possible but will have to monitor status closely. She is currently maintaining blood pressure and there is no indication for urgent transvenous pacing. Will  follow with you. Appreciate PCCM's care.  Signed, Dayna Dunn PA-C 04/13/2014, 2:24 PM   Personally seen and examined. Agree with above. Currently sedate on vent.  Had lengthy discussion with family (Dr. Craige Cotta and Kriste Basque present). They understand gravity of the situation. She was very functional at [facility] prior to this AM. Did have an ativan her daughter states. Was taking metoprolol and diltiazem. K 7. HR now 34. I was able to adjust temp percutaneous pacer to 70mA and pace effectively. Hopefully as K corrects and Bb and diltiazem wash out her heart rate will improve. We will periodically test with decreasing mA of pacer output. Was taking KCL 20 BID at home with lasix. Unsure what precipitated this event however as she was stable on these medications for quite sometime. BP is currently in the 100's systolic.   Will continue to provide current care measures. After discussing possible temp internal pacing wire, we mutually decided not to pursue this invasive measure. (currently pacing externally with SBP 100's).   Hopefully we will see resolution of current state. I expressed my concern however given her advanced age.   Appreciate Dr. Craige Cotta and his CCM team.   Critical care time 40 min spent with data analysis, review of history, discussion with several MD's and family in her current state of severe illness.   Donato Schultz, MD

## 2014-04-14 ENCOUNTER — Inpatient Hospital Stay (HOSPITAL_COMMUNITY): Payer: Medicare Other

## 2014-04-14 DIAGNOSIS — I482 Chronic atrial fibrillation: Secondary | ICD-10-CM

## 2014-04-14 DIAGNOSIS — N183 Chronic kidney disease, stage 3 (moderate): Secondary | ICD-10-CM

## 2014-04-14 DIAGNOSIS — N179 Acute kidney failure, unspecified: Secondary | ICD-10-CM

## 2014-04-14 DIAGNOSIS — I5020 Unspecified systolic (congestive) heart failure: Secondary | ICD-10-CM

## 2014-04-14 DIAGNOSIS — I5040 Unspecified combined systolic (congestive) and diastolic (congestive) heart failure: Secondary | ICD-10-CM

## 2014-04-14 LAB — CBC
HCT: 40 % (ref 36.0–46.0)
Hemoglobin: 12.8 g/dL (ref 12.0–15.0)
MCH: 29.4 pg (ref 26.0–34.0)
MCHC: 32 g/dL (ref 30.0–36.0)
MCV: 91.7 fL (ref 78.0–100.0)
Platelets: 215 K/uL (ref 150–400)
RBC: 4.36 MIL/uL (ref 3.87–5.11)
RDW: 13.8 % (ref 11.5–15.5)
WBC: 12.9 K/uL — ABNORMAL HIGH (ref 4.0–10.5)

## 2014-04-14 LAB — BASIC METABOLIC PANEL WITH GFR
Anion gap: 8 (ref 5–15)
BUN: 27 mg/dL — ABNORMAL HIGH (ref 6–23)
CO2: 29 mmol/L (ref 19–32)
Calcium: 9.8 mg/dL (ref 8.4–10.5)
Chloride: 105 mmol/L (ref 96–112)
Creatinine, Ser: 1.53 mg/dL — ABNORMAL HIGH (ref 0.50–1.10)
GFR calc Af Amer: 33 mL/min — ABNORMAL LOW
GFR calc non Af Amer: 28 mL/min — ABNORMAL LOW
Glucose, Bld: 104 mg/dL — ABNORMAL HIGH (ref 70–99)
Potassium: 4.1 mmol/L (ref 3.5–5.1)
Sodium: 142 mmol/L (ref 135–145)

## 2014-04-14 LAB — BASIC METABOLIC PANEL
Anion gap: 10 (ref 5–15)
BUN: 25 mg/dL — ABNORMAL HIGH (ref 6–23)
CO2: 29 mmol/L (ref 19–32)
Calcium: 9.6 mg/dL (ref 8.4–10.5)
Chloride: 105 mmol/L (ref 96–112)
Creatinine, Ser: 1.44 mg/dL — ABNORMAL HIGH (ref 0.50–1.10)
GFR calc Af Amer: 35 mL/min — ABNORMAL LOW (ref >90)
GFR, EST NON AFRICAN AMERICAN: 30 mL/min — AB (ref >90)
Glucose, Bld: 142 mg/dL — ABNORMAL HIGH (ref 70–99)
Potassium: 4.1 mmol/L (ref 3.5–5.1)
Sodium: 144 mmol/L (ref 135–145)

## 2014-04-14 LAB — GLUCOSE, CAPILLARY: GLUCOSE-CAPILLARY: 119 mg/dL — AB (ref 70–99)

## 2014-04-14 LAB — TROPONIN I: TROPONIN I: 0.05 ng/mL — AB (ref <0.031)

## 2014-04-14 LAB — MAGNESIUM: Magnesium: 2.2 mg/dL (ref 1.5–2.5)

## 2014-04-14 LAB — PHOSPHORUS: Phosphorus: 2.5 mg/dL (ref 2.3–4.6)

## 2014-04-14 MED ORDER — CETYLPYRIDINIUM CHLORIDE 0.05 % MT LIQD
7.0000 mL | Freq: Two times a day (BID) | OROMUCOSAL | Status: DC
  Administered 2014-04-14 – 2014-04-16 (×4): 7 mL via OROMUCOSAL

## 2014-04-14 MED ORDER — HYDRALAZINE HCL 10 MG PO TABS
10.0000 mg | ORAL_TABLET | Freq: Four times a day (QID) | ORAL | Status: DC
  Administered 2014-04-14: 10 mg via ORAL
  Filled 2014-04-14 (×4): qty 1

## 2014-04-14 MED ORDER — APIXABAN 2.5 MG PO TABS
2.5000 mg | ORAL_TABLET | Freq: Two times a day (BID) | ORAL | Status: DC
  Administered 2014-04-14 – 2014-04-16 (×5): 2.5 mg via ORAL
  Filled 2014-04-14 (×6): qty 1

## 2014-04-14 MED FILL — Medication: Qty: 1 | Status: AC

## 2014-04-14 NOTE — Progress Notes (Signed)
CSW order in place- "Patient admitted from a facility"- she is a resident of independent living- [facility].  Patient may require increased level of care due to hospitalization. Will need to monitor and await PT recommendation to determine if this will be necessary.  SW Psychosocial assessment to follow. Message left for [facility] to call back re: patient's level of care needs. Lorri Frederickonna T. Jaci LazierCrowder, KentuckyLCSW [phone]

## 2014-04-14 NOTE — Progress Notes (Signed)
PULMONARY / CRITICAL CARE MEDICINE   Name: [name] MRN: [id number] DOB: 1918-08-15    ADMISSION DATE:  04/13/2014 CONSULTATION DATE:  04/13/2014  REFERRING MD :  Antonieta LovelessEDP Miller  CHIEF COMPLAINT:  Bradycardia  INITIAL PRESENTATION: 81 y/o F w/ PMHx of chronic atrial fibrillation, chronic combined CHF, asthma, and CKD stage 3, presented to ED 3/8 after syncopal episode at home. In ED she was found to be bradycardic (20) in complete heart block. Intubated for airway protection and transcutaneously paced in ED. Hyperkalemic, treated in ED. PCCM to admit.   STUDIES:  CXR 3/9 >> NACPD EKG 3/9 >> Atrial Fibrillation, rate controlled.   SIGNIFICANT EVENTS: 3/8 - Syncopal, Complete heart block, intubated, to ICU.  SUBJECTIVE: No complaints, alert, follows commands. On vent. No overnight events.   VITAL SIGNS: Temp:  [96.4 F (35.8 C)-98.7 F (37.1 C)] 98.7 F (37.1 C) (03/09 0400) Pulse Rate:  [24-73] 44 (03/09 0600) Resp:  [10-40] 12 (03/09 0600) BP: (90-167)/(43-93) 144/60 mmHg (03/09 0600) SpO2:  [97 %-100 %] 100 % (03/09 0600) FiO2 (%):  [40 %-60 %] 40 % (03/09 0400) Weight:  [143 lb 4.8 oz (65 kg)-160 lb 15 oz (73 kg)] 160 lb 15 oz (73 kg) (03/09 0500)   HEMODYNAMICS:   VENTILATOR SETTINGS: Vent Mode:  [-] PRVC FiO2 (%):  [40 %-60 %] 40 % Set Rate:  [14 bmp-16 bmp] 14 bmp Vt Set:  [500 mL] 500 mL PEEP:  [5 cmH20] 5 cmH20 Plateau Pressure:  [18 cmH20-20 cmH20] 19 cmH20   INTAKE / OUTPUT:  Intake/Output Summary (Last 24 hours) at 04/14/14 0731 Last data filed at 04/14/14 0600  Gross per 24 hour  Intake 791.42 ml  Output   1340 ml  Net -548.58 ml    PHYSICAL EXAMINATION: General:  Elderly white female, alert, in NAD. Intubated.  Neuro: Follows commands. RASS +1. Moves all extremities spontaneously.  HEENT: Neck supple, no lymphadenopathy. OETT/OGT in place.  Cardiovascular: Irregular rhythm, no obvious murmurs, gallops, or rubs.  Lungs: Lungs clear bilaterally.  No wheezes or rales.  Abdomen:  Soft, non-tender, non-distended, BS + Musculoskeletal:  No acute deformity. No edema.  Skin: Grossly intact, no erythema or rashes.   LABS:  CBC  Recent Labs Lab 04/13/14 1405 04/13/14 1413 04/13/14 1633 04/14/14 0345  WBC 12.0*  --  12.4* 12.9*  HGB 14.1 13.6 14.3 12.8  HCT 44.5 40.0 44.2 40.0  PLT 229  --  229 215   Coag's  Recent Labs Lab 04/13/14 1346  INR 1.46   BMET  Recent Labs Lab 04/13/14 1633 04/13/14 2123 04/14/14 0345  NA 143 143 142  K 5.6* 4.4 4.1  CL 106 103 105  CO2 26 32 29  BUN 29* 27* 27*  CREATININE 1.71*  1.71* 1.75* 1.53*  GLUCOSE 111* 140* 104*   Electrolytes  Recent Labs Lab 04/13/14 1633 04/13/14 2123 04/14/14 0345  CALCIUM 11.2* 10.7* 9.8  MG 2.3  --  2.2  PHOS 2.8  --  2.5   Sepsis Markers  Recent Labs Lab 04/13/14 1633  LATICACIDVEN 2.9*   ABG  Recent Labs Lab 04/13/14 1522  PHART 7.514*  PCO2ART 35.1  PO2ART 149.0*   Cardiac Enzymes  Recent Labs Lab 04/13/14 1633 04/13/14 2123 04/14/14 0335  TROPONINI 0.06* 0.05* 0.05*    Imaging Dg Chest Portable 1 View  04/13/2014   CLINICAL DATA:  81 year old female status post intubation and nasogastric tube placement  EXAM: PORTABLE CHEST - 1 VIEW  COMPARISON:  Prior chest x-ray 04/13/2014  FINDINGS: The endotracheal tube is 4.2 cm above the carina. The tip of the nasogastric tube is not imaged but falls below the diaphragm presumably within the stomach. Stable borderline cardiomegaly. Atherosclerotic calcification present within the transverse aorta. No change in the appearance of the lungs. Persistent bronchitic change. Is no acute osseous abnormality.  IMPRESSION: 1. The tip of the endotracheal tube is 4.2 cm above the carina. 2. The tip of the nasogastric tube lies off the field of view, below the diaphragm and presumably within the stomach. 3. No interval change in the appearance of the lungs were chest.   Electronically Signed   By:  Malachy Moan M.D.   On: 04/13/2014 14:39   Dg Chest Portable 1 View  04/13/2014   CLINICAL DATA:  Bradycardia  EXAM: PORTABLE CHEST - 1 VIEW  COMPARISON:  03/19/2011  FINDINGS: Cardiac shadow remains mildly enlarged. The lungs are well aerated bilaterally. No focal infiltrate or sizable effusion is seen. No acute bony abnormality is seen.  IMPRESSION: No acute abnormality noted.   Electronically Signed   By: Alcide Clever M.D.   On: 04/13/2014 14:02    ASSESSMENT / PLAN:  PULMONARY OETT 3/8 >>> A: Acute respiratory failure 2/2 acute encephalopathy P:   Full vent support ABg reviewed, if back to rest, reduce MV (TV) Wean to extubate this AM, plan cpap 5 ps 5, goal 30, assess rsbi Neurostatus supports extubation  CARDIOVASCULAR A:  Complete Heart Block; most likely 2/2 hyperkalemia; resolved Chronic Combined CHF (2013 - LVEF 45-50%) Chronic Atrial Fibrillation on Eliquis HTN ?BB/CCB unintentional overdose P:  Telemetry monitoring Hold home antihypertensives (dilt, metoprolol); restart as appropriate PRN hydralazine Cardiology following Restart Eliquis Tele bmet again in am   RENAL A:   AKI (baseline creat 1.2); improving Hyperkalemia; resolved.  P:   BMP q12h KVO IVF Hold home Lasix, K supplementation  GASTROINTESTINAL A:   No acute issues GI PPx P:   Protonix IV NPO  HEMATOLOGIC A:   Chronic anticoagulation with Eliquis for AF  Subtherapeutic INR P:  Follow CBC Restart Eliquis  INFECTIOUS A:   No acute issues P:   Monitor CBC, CXR  ENDOCRINE A:   No acute issues P:   Follow glucose on chemistry Add SSI if consistently over 180  NEUROLOGIC A:   Acute metabolic encephalopathy P:   RASS goal: -1 PRN versed for sedation Fentanyl gtt for analgesia Monitor   FAMILY  - Updates: Daughter updated at bedside 04/14/14  - Inter-disciplinary family meet or Palliative Care meeting due by:  3/15    Lauris Chroman, MD PGY-2, Internal  Medicine Pager: [phone]  STAFF NOTE: Cindi Carbon, MD FACP have personally reviewed patient's available data, including medical history, events of note, physical examination and test results as part of my evaluation. I have discussed with resident/NP and other care providers such as pharmacist, RN and RRT. In addition, I personally evaluated patient and elicited key findings of: awake, alert, weaning well, great TV response, K wnl now, no longer brady, bemt q12h remain, counseled daughter for holding lasix, k, mag further, wua on going Weaning to extubate as plan The patient is critically ill with multiple organ systems failure and requires high complexity decision making for assessment and support, frequent evaluation and titration of therapies, application of advanced monitoring technologies and extensive interpretation of multiple databases.   Critical Care Time devoted to patient care services described in this note is30  Minutes.  This time reflects time of care of this signee: Rory Percyaniel Feinstein, MD FACP. This critical care time does not reflect procedure time, or teaching time or supervisory time of PA/NP/Med student/Med Resident etc but could involve care discussion time. Rest per NP/medical resident whose note is outlined above and that I agree with   Mcarthur Rossettianiel J. Tyson AliasFeinstein, MD, FACP Pgr: [phone] [facility] 04/14/2014 8:48 AM

## 2014-04-14 NOTE — Care Management Note (Addendum)
    Page 1 of 1   04/16/2014     1:23:38 PM CARE MANAGEMENT NOTE 04/16/2014  Patient:  [name],[name]   Account Number:  [id number]  Date Initiated:  04/14/2014  Documentation initiated by:  Junius CreamerWELL,DEBBIE  Subjective/Objective Assessment:   adm w symptomatic brady-vent     Action/Plan:   from [facility]   Anticipated DC Date:     Anticipated DC Plan:  SKILLED NURSING FACILITY  In-house referral  Clinical Social Worker         Choice offered to / List presented to:             Status of service:  Completed, signed off Medicare Important Message given?  YES (If response is "NO", the following Medicare IM given date fields will be blank) Date Medicare IM given:  04/16/2014 Medicare IM given by:  GRAVES-BIGELOW,Shabreka Coulon Date Additional Medicare IM given:   Additional Medicare IM given by:    Discharge Disposition:  SKILLED NURSING FACILITY  Per UR Regulation:  Reviewed for med. necessity/level of care/duration of stay  If discussed at Long Length of Stay Meetings, dates discussed:    Comments:  04-16-14 1320 Tomi BambergerBrenda Graves-Bigelow, RN, BSN [phone] [facility] No needs from CM at this time.

## 2014-04-14 NOTE — Progress Notes (Signed)
Subjective:  Pt alert, follows commands. Intubated, hemodynamically stable  Objective:  Temp:  [96.4 F (35.8 C)-98.7 F (37.1 C)] 98.7 F (37.1 C) (03/09 0400) Pulse Rate:  [24-73] 44 (03/09 0600) Resp:  [10-40] 12 (03/09 0600) BP: (90-167)/(43-93) 144/60 mmHg (03/09 0600) SpO2:  [97 %-100 %] 100 % (03/09 0600) FiO2 (%):  [40 %-60 %] 40 % (03/09 0400) Weight:  [143 lb 4.8 oz (65 kg)-160 lb 15 oz (73 kg)] 160 lb 15 oz (73 kg) (03/09 0500) Weight change:   Intake/Output from previous day: 03/08 0701 - 03/09 0700 In: 791.4 [I.V.:791.4] Out: 1340 [Urine:1340]  Intake/Output from this shift:    Physical Exam: General appearance: alert and no distress Neck: no adenopathy, no carotid bruit, no JVD, supple, symmetrical, trachea midline and thyroid not enlarged, symmetric, no tenderness/mass/nodules Lungs: clear to auscultation bilaterally Heart: irregularly irregular rhythm Extremities: extremities normal, atraumatic, no cyanosis or edema  Lab Results: Results for orders placed or performed during the hospital encounter of 04/13/14 (from the past 48 hour(s))  Protime-INR     Status: Abnormal   Collection Time: 04/13/14  1:46 PM  Result Value Ref Range   Prothrombin Time 17.9 (H) 11.6 - 15.2 seconds   INR 1.46 0.00 - 1.49  I-Stat Troponin, ED (not at [facility])     Status: None   Collection Time: 04/13/14  1:51 PM  Result Value Ref Range   Troponin i, poc 0.02 0.00 - 0.08 ng/mL   Comment 3            Comment: Due to the release kinetics of cTnI, a negative result within the first hours of the onset of symptoms does not rule out myocardial infarction with certainty. If myocardial infarction is still suspected, repeat the test at appropriate intervals.   I-Stat Chem 8, ED     Status: Abnormal   Collection Time: 04/13/14  1:53 PM  Result Value Ref Range   Sodium 139 135 - 145 mmol/L   Potassium 7.0 (HH) 3.5 - 5.1 mmol/L   Chloride 104 96 - 112 mmol/L   BUN 33 (H) 6 -  23 mg/dL   Creatinine, Ser 1.70 (H) 0.50 - 1.10 mg/dL   Glucose, Bld 200 (H) 70 - 99 mg/dL   Calcium, Ion 1.21 1.13 - 1.30 mmol/L   TCO2 23 0 - 100 mmol/L   Hemoglobin 16.0 (H) 12.0 - 15.0 g/dL   HCT 47.0 (H) 36.0 - 46.0 %   Comment NOTIFIED PHYSICIAN   CBC with Differential     Status: Abnormal   Collection Time: 04/13/14  2:05 PM  Result Value Ref Range   WBC 12.0 (H) 4.0 - 10.5 K/uL   RBC 4.73 3.87 - 5.11 MIL/uL   Hemoglobin 14.1 12.0 - 15.0 g/dL   HCT 44.5 36.0 - 46.0 %   MCV 94.1 78.0 - 100.0 fL   MCH 29.8 26.0 - 34.0 pg   MCHC 31.7 30.0 - 36.0 g/dL   RDW 13.7 11.5 - 15.5 %   Platelets 229 150 - 400 K/uL   Neutrophils Relative % 78 (H) 43 - 77 %   Neutro Abs 9.3 (H) 1.7 - 7.7 K/uL   Lymphocytes Relative 17 12 - 46 %   Lymphs Abs 2.0 0.7 - 4.0 K/uL   Monocytes Relative 5 3 - 12 %   Monocytes Absolute 0.6 0.1 - 1.0 K/uL   Eosinophils Relative 0 0 - 5 %   Eosinophils Absolute 0.0 0.0 - 0.7  K/uL   Basophils Relative 0 0 - 1 %   Basophils Absolute 0.0 0.0 - 0.1 K/uL  Potassium     Status: Abnormal   Collection Time: 04/13/14  2:05 PM  Result Value Ref Range   Potassium 6.6 (HH) 3.5 - 5.1 mmol/L    Comment: REPEATED TO VERIFY CRITICAL RESULT CALLED TO, READ BACK BY AND VERIFIED WITH: Melina Modena 03.08.16 1531 M SHIPMAN   I-Stat Chem 8, ED     Status: Abnormal   Collection Time: 04/13/14  2:13 PM  Result Value Ref Range   Sodium 142 135 - 145 mmol/L   Potassium 6.3 (HH) 3.5 - 5.1 mmol/L   Chloride 105 96 - 112 mmol/L   BUN 30 (H) 6 - 23 mg/dL   Creatinine, Ser 1.60 (H) 0.50 - 1.10 mg/dL   Glucose, Bld 166 (H) 70 - 99 mg/dL   Calcium, Ion 1.52 (H) 1.13 - 1.30 mmol/L   TCO2 26 0 - 100 mmol/L   Hemoglobin 13.6 12.0 - 15.0 g/dL   HCT 40.0 36.0 - 46.0 %  I-Stat arterial blood gas, ED     Status: Abnormal   Collection Time: 04/13/14  3:22 PM  Result Value Ref Range   pH, Arterial 7.514 (H) 7.350 - 7.450   pCO2 arterial 35.1 35.0 - 45.0 mmHg   pO2, Arterial 149.0 (H)  80.0 - 100.0 mmHg   Bicarbonate 28.3 (H) 20.0 - 24.0 mEq/L   TCO2 29 0 - 100 mmol/L   O2 Saturation 100.0 %   Acid-Base Excess 5.0 (H) 0.0 - 2.0 mmol/L   Patient temperature 98.6 F    Collection site RADIAL, ALLEN'S TEST ACCEPTABLE    Drawn by RT    Sample type ARTERIAL   Urinalysis, Routine w reflex microscopic     Status: None   Collection Time: 04/13/14  3:40 PM  Result Value Ref Range   Color, Urine YELLOW YELLOW   APPearance CLEAR CLEAR   Specific Gravity, Urine 1.010 1.005 - 1.030   pH 7.5 5.0 - 8.0   Glucose, UA NEGATIVE NEGATIVE mg/dL   Hgb urine dipstick NEGATIVE NEGATIVE   Bilirubin Urine NEGATIVE NEGATIVE   Ketones, ur NEGATIVE NEGATIVE mg/dL   Protein, ur NEGATIVE NEGATIVE mg/dL   Urobilinogen, UA 0.2 0.0 - 1.0 mg/dL   Nitrite NEGATIVE NEGATIVE   Leukocytes, UA NEGATIVE NEGATIVE    Comment: MICROSCOPIC NOT DONE ON URINES WITH NEGATIVE PROTEIN, BLOOD, LEUKOCYTES, NITRITE, OR GLUCOSE <1000 mg/dL.  CBC     Status: Abnormal   Collection Time: 04/13/14  4:33 PM  Result Value Ref Range   WBC 12.4 (H) 4.0 - 10.5 K/uL   RBC 4.70 3.87 - 5.11 MIL/uL   Hemoglobin 14.3 12.0 - 15.0 g/dL   HCT 44.2 36.0 - 46.0 %   MCV 94.0 78.0 - 100.0 fL   MCH 30.4 26.0 - 34.0 pg   MCHC 32.4 30.0 - 36.0 g/dL   RDW 13.6 11.5 - 15.5 %   Platelets 229 150 - 400 K/uL  Creatinine, serum     Status: Abnormal   Collection Time: 04/13/14  4:33 PM  Result Value Ref Range   Creatinine, Ser 1.71 (H) 0.50 - 1.10 mg/dL   GFR calc non Af Amer 25 (L) >90 mL/min   GFR calc Af Amer 29 (L) >90 mL/min    Comment: (NOTE) The eGFR has been calculated using the CKD EPI equation. This calculation has not been validated in all clinical situations. eGFR's persistently <90  mL/min signify possible Chronic Kidney Disease.   Basic metabolic panel     Status: Abnormal   Collection Time: 04/13/14  4:33 PM  Result Value Ref Range   Sodium 143 135 - 145 mmol/L   Potassium 5.6 (H) 3.5 - 5.1 mmol/L   Chloride 106  96 - 112 mmol/L   CO2 26 19 - 32 mmol/L   Glucose, Bld 111 (H) 70 - 99 mg/dL   BUN 29 (H) 6 - 23 mg/dL   Creatinine, Ser 1.71 (H) 0.50 - 1.10 mg/dL   Calcium 11.2 (H) 8.4 - 10.5 mg/dL   GFR calc non Af Amer 25 (L) >90 mL/min   GFR calc Af Amer 29 (L) >90 mL/min    Comment: (NOTE) The eGFR has been calculated using the CKD EPI equation. This calculation has not been validated in all clinical situations. eGFR's persistently <90 mL/min signify possible Chronic Kidney Disease.    Anion gap 11 5 - 15  Troponin I     Status: Abnormal   Collection Time: 04/13/14  4:33 PM  Result Value Ref Range   Troponin I 0.06 (H) <0.031 ng/mL    Comment:        PERSISTENTLY INCREASED TROPONIN VALUES IN THE RANGE OF 0.04-0.49 ng/mL CAN BE SEEN IN:       -UNSTABLE ANGINA       -CONGESTIVE HEART FAILURE       -MYOCARDITIS       -CHEST TRAUMA       -ARRYHTHMIAS       -LATE PRESENTING MYOCARDIAL INFARCTION       -COPD   CLINICAL FOLLOW-UP RECOMMENDED.   Lactic acid, plasma     Status: Abnormal   Collection Time: 04/13/14  4:33 PM  Result Value Ref Range   Lactic Acid, Venous 2.9 (HH) 0.5 - 2.0 mmol/L    Comment: REPEATED TO VERIFY CRITICAL RESULT CALLED TO, READ BACK BY AND VERIFIED WITH: E SMITH,RN 03.08.16 1815 M SHIPMAN   Magnesium     Status: None   Collection Time: 04/13/14  4:33 PM  Result Value Ref Range   Magnesium 2.3 1.5 - 2.5 mg/dL  Phosphorus     Status: None   Collection Time: 04/13/14  4:33 PM  Result Value Ref Range   Phosphorus 2.8 2.3 - 4.6 mg/dL  MRSA PCR Screening     Status: None   Collection Time: 04/13/14  6:14 PM  Result Value Ref Range   MRSA by PCR NEGATIVE NEGATIVE    Comment:        The GeneXpert MRSA Assay (FDA approved for NASAL specimens only), is one component of a comprehensive MRSA colonization surveillance program. It is not intended to diagnose MRSA infection nor to guide or monitor treatment for MRSA infections.   Troponin I     Status:  Abnormal   Collection Time: 04/13/14  9:23 PM  Result Value Ref Range   Troponin I 0.05 (H) <0.031 ng/mL    Comment:        PERSISTENTLY INCREASED TROPONIN VALUES IN THE RANGE OF 0.04-0.49 ng/mL CAN BE SEEN IN:       -UNSTABLE ANGINA       -CONGESTIVE HEART FAILURE       -MYOCARDITIS       -CHEST TRAUMA       -ARRYHTHMIAS       -LATE PRESENTING MYOCARDIAL INFARCTION       -COPD   CLINICAL FOLLOW-UP RECOMMENDED.   Basic metabolic panel  Status: Abnormal   Collection Time: 04/13/14  9:23 PM  Result Value Ref Range   Sodium 143 135 - 145 mmol/L   Potassium 4.4 3.5 - 5.1 mmol/L    Comment: DELTA CHECK NOTED   Chloride 103 96 - 112 mmol/L   CO2 32 19 - 32 mmol/L   Glucose, Bld 140 (H) 70 - 99 mg/dL   BUN 27 (H) 6 - 23 mg/dL   Creatinine, Ser 1.75 (H) 0.50 - 1.10 mg/dL   Calcium 10.7 (H) 8.4 - 10.5 mg/dL   GFR calc non Af Amer 24 (L) >90 mL/min   GFR calc Af Amer 28 (L) >90 mL/min    Comment: (NOTE) The eGFR has been calculated using the CKD EPI equation. This calculation has not been validated in all clinical situations. eGFR's persistently <90 mL/min signify possible Chronic Kidney Disease.    Anion gap 8 5 - 15  Troponin I     Status: Abnormal   Collection Time: 04/14/14  3:35 AM  Result Value Ref Range   Troponin I 0.05 (H) <0.031 ng/mL    Comment:        PERSISTENTLY INCREASED TROPONIN VALUES IN THE RANGE OF 0.04-0.49 ng/mL CAN BE SEEN IN:       -UNSTABLE ANGINA       -CONGESTIVE HEART FAILURE       -MYOCARDITIS       -CHEST TRAUMA       -ARRYHTHMIAS       -LATE PRESENTING MYOCARDIAL INFARCTION       -COPD   CLINICAL FOLLOW-UP RECOMMENDED.   CBC     Status: Abnormal   Collection Time: 04/14/14  3:45 AM  Result Value Ref Range   WBC 12.9 (H) 4.0 - 10.5 K/uL   RBC 4.36 3.87 - 5.11 MIL/uL   Hemoglobin 12.8 12.0 - 15.0 g/dL   HCT 40.0 36.0 - 46.0 %   MCV 91.7 78.0 - 100.0 fL   MCH 29.4 26.0 - 34.0 pg   MCHC 32.0 30.0 - 36.0 g/dL   RDW 13.8 11.5 - 15.5 %     Platelets 215 150 - 400 K/uL  Basic metabolic panel     Status: Abnormal   Collection Time: 04/14/14  3:45 AM  Result Value Ref Range   Sodium 142 135 - 145 mmol/L   Potassium 4.1 3.5 - 5.1 mmol/L   Chloride 105 96 - 112 mmol/L   CO2 29 19 - 32 mmol/L   Glucose, Bld 104 (H) 70 - 99 mg/dL   BUN 27 (H) 6 - 23 mg/dL   Creatinine, Ser 1.53 (H) 0.50 - 1.10 mg/dL   Calcium 9.8 8.4 - 10.5 mg/dL   GFR calc non Af Amer 28 (L) >90 mL/min   GFR calc Af Amer 33 (L) >90 mL/min    Comment: (NOTE) The eGFR has been calculated using the CKD EPI equation. This calculation has not been validated in all clinical situations. eGFR's persistently <90 mL/min signify possible Chronic Kidney Disease.    Anion gap 8 5 - 15  Magnesium     Status: None   Collection Time: 04/14/14  3:45 AM  Result Value Ref Range   Magnesium 2.2 1.5 - 2.5 mg/dL  Phosphorus     Status: None   Collection Time: 04/14/14  3:45 AM  Result Value Ref Range   Phosphorus 2.5 2.3 - 4.6 mg/dL  Glucose, capillary     Status: Abnormal   Collection Time: 04/14/14  8:00 AM  Result Value Ref Range   Glucose-Capillary 119 (H) 70 - 99 mg/dL   Comment 1 Notify RN     Imaging: Imaging results have been reviewed  Tele- Afib with CVR  Assessment/Plan:   1. Principal Problem: 2.   Symptomatic bradycardia 3. Active Problems: 4.   Hypertension 5.   CHF, secondary to rapid AF and diastolic dysfunction. EF 45-50% 6.   Chronic anticoagulation 7.   ?TIA on medication, while Coumadin theraputic 8.   Chronic atrial fibrillation 9.   Hyperkalemia 10.   Acute renal failure superimposed on stage 3 chronic kidney disease 11.   Complete heart block 12.   Time Spent Directly with Patient:  20 minutes  Length of Stay:  LOS: 1 day   1. Bradycardia- Improved after correction of severe hyperkalemia. H/O CAF on BB/CCB, Eliquis (all on hold). K improved. VR in 70s. Restart Eliquis. Hold neg Chronotropes for now.  2. VDRF- Intubated to  protect her airway after Rx with Versed and Fentanyl for chest wall pain secondary to Zoll pacer. PCCM managing. Suspect she should be extubatable.  3. Hyperkalemia- K back to nl. On lasix and K repletion as OP. Also has CKD 3. Will need to be careful with OP meds. D/C K repletion   Armondo Cech J 04/14/2014, 8:22 AM

## 2014-04-14 NOTE — Progress Notes (Signed)
PULMONARY / CRITICAL CARE MEDICINE   Name: [name] MRN: [id number] DOB: 1918-08-15    ADMISSION DATE:  04/13/2014 CONSULTATION DATE:  04/13/2014  REFERRING MD :  Antonieta LovelessEDP Miller  CHIEF COMPLAINT:  Bradycardia  INITIAL PRESENTATION: 81 y/o F w/ PMHx of chronic atrial fibrillation, chronic combined CHF, asthma, and CKD stage 3, presented to ED 3/8 after syncopal episode at home. In ED she was found to be bradycardic (20) in complete heart block. Intubated for airway protection and transcutaneously paced in ED. Hyperkalemic, treated in ED. PCCM to admit.   STUDIES:  CXR 3/9 >> NACPD EKG 3/9 >> Atrial Fibrillation, rate controlled.   SIGNIFICANT EVENTS: 3/8 - Syncopal, Complete heart block, intubated, to ICU.  SUBJECTIVE: No complaints, alert, follows commands. On vent. No overnight events.   VITAL SIGNS: Temp:  [96.4 F (35.8 C)-98.7 F (37.1 C)] 98.7 F (37.1 C) (03/09 0400) Pulse Rate:  [24-73] 44 (03/09 0600) Resp:  [10-40] 12 (03/09 0600) BP: (90-167)/(43-93) 144/60 mmHg (03/09 0600) SpO2:  [97 %-100 %] 100 % (03/09 0600) FiO2 (%):  [40 %-60 %] 40 % (03/09 0400) Weight:  [143 lb 4.8 oz (65 kg)-160 lb 15 oz (73 kg)] 160 lb 15 oz (73 kg) (03/09 0500)   HEMODYNAMICS:   VENTILATOR SETTINGS: Vent Mode:  [-] PRVC FiO2 (%):  [40 %-60 %] 40 % Set Rate:  [14 bmp-16 bmp] 14 bmp Vt Set:  [500 mL] 500 mL PEEP:  [5 cmH20] 5 cmH20 Plateau Pressure:  [18 cmH20-20 cmH20] 19 cmH20   INTAKE / OUTPUT:  Intake/Output Summary (Last 24 hours) at 04/14/14 0731 Last data filed at 04/14/14 0600  Gross per 24 hour  Intake 791.42 ml  Output   1340 ml  Net -548.58 ml    PHYSICAL EXAMINATION: General:  Elderly white female, alert, in NAD. Intubated.  Neuro: Follows commands. RASS +1. Moves all extremities spontaneously.  HEENT: Neck supple, no lymphadenopathy. OETT/OGT in place.  Cardiovascular: Irregular rhythm, no obvious murmurs, gallops, or rubs.  Lungs: Lungs clear bilaterally.  No wheezes or rales.  Abdomen:  Soft, non-tender, non-distended, BS + Musculoskeletal:  No acute deformity. No edema.  Skin: Grossly intact, no erythema or rashes.   LABS:  CBC  Recent Labs Lab 04/13/14 1405 04/13/14 1413 04/13/14 1633 04/14/14 0345  WBC 12.0*  --  12.4* 12.9*  HGB 14.1 13.6 14.3 12.8  HCT 44.5 40.0 44.2 40.0  PLT 229  --  229 215   Coag's  Recent Labs Lab 04/13/14 1346  INR 1.46   BMET  Recent Labs Lab 04/13/14 1633 04/13/14 2123 04/14/14 0345  NA 143 143 142  K 5.6* 4.4 4.1  CL 106 103 105  CO2 26 32 29  BUN 29* 27* 27*  CREATININE 1.71*  1.71* 1.75* 1.53*  GLUCOSE 111* 140* 104*   Electrolytes  Recent Labs Lab 04/13/14 1633 04/13/14 2123 04/14/14 0345  CALCIUM 11.2* 10.7* 9.8  MG 2.3  --  2.2  PHOS 2.8  --  2.5   Sepsis Markers  Recent Labs Lab 04/13/14 1633  LATICACIDVEN 2.9*   ABG  Recent Labs Lab 04/13/14 1522  PHART 7.514*  PCO2ART 35.1  PO2ART 149.0*   Cardiac Enzymes  Recent Labs Lab 04/13/14 1633 04/13/14 2123 04/14/14 0335  TROPONINI 0.06* 0.05* 0.05*    Imaging Dg Chest Portable 1 View  04/13/2014   CLINICAL DATA:  81 year old female status post intubation and nasogastric tube placement  EXAM: PORTABLE CHEST - 1 VIEW  COMPARISON:  Prior chest x-ray 04/13/2014  FINDINGS: The endotracheal tube is 4.2 cm above the carina. The tip of the nasogastric tube is not imaged but falls below the diaphragm presumably within the stomach. Stable borderline cardiomegaly. Atherosclerotic calcification present within the transverse aorta. No change in the appearance of the lungs. Persistent bronchitic change. Is no acute osseous abnormality.  IMPRESSION: 1. The tip of the endotracheal tube is 4.2 cm above the carina. 2. The tip of the nasogastric tube lies off the field of view, below the diaphragm and presumably within the stomach. 3. No interval change in the appearance of the lungs were chest.   Electronically Signed   By:  Malachy Moan M.D.   On: 04/13/2014 14:39   Dg Chest Portable 1 View  04/13/2014   CLINICAL DATA:  Bradycardia  EXAM: PORTABLE CHEST - 1 VIEW  COMPARISON:  03/19/2011  FINDINGS: Cardiac shadow remains mildly enlarged. The lungs are well aerated bilaterally. No focal infiltrate or sizable effusion is seen. No acute bony abnormality is seen.  IMPRESSION: No acute abnormality noted.   Electronically Signed   By: Alcide Clever M.D.   On: 04/13/2014 14:02    ASSESSMENT / PLAN:  PULMONARY OETT 3/8 >>> A: Acute respiratory failure 2/2 acute encephalopathy P:   Full vent support Wean to extubate this AM  CARDIOVASCULAR A:  Complete Heart Block; most likely 2/2 hyperkalemia; resolved Chronic Combined CHF (2013 - LVEF 45-50%) Chronic Atrial Fibrillation on Eliquis HTN ?BB/CCB unintentional overdose P:  Telemetry monitoring Hold home antihypertensives (dilt, metoprolol); restart as appropriate PRN hydralazine Cardiology following Restart Eliquis  RENAL A:   AKI (baseline creat 1.2); improving Hyperkalemia; resolved.  P:   BMP q12h KVO IVF Hold home Lasix, K supplementation  GASTROINTESTINAL A:   No acute issues GI PPx P:   Protonix IV NPO  HEMATOLOGIC A:   Chronic anticoagulation with Eliquis for AF  Subtherapeutic INR P:  Follow CBC Restart Eliquis  INFECTIOUS A:   No acute issues P:   Monitor CBC, CXR  ENDOCRINE A:   No acute issues P:   Follow glucose on chemistry Add SSI if consistently over 180  NEUROLOGIC A:   Acute metabolic encephalopathy P:   RASS goal: -1 PRN versed for sedation Fentanyl gtt for analgesia Monitor   FAMILY  - Updates: Daughter updated at bedside 04/14/14  - Inter-disciplinary family meet or Palliative Care meeting due by:  3/15    Lauris Chroman, MD PGY-2, Internal Medicine Pager: [phone]

## 2014-04-14 NOTE — Procedures (Signed)
Extubation Procedure Note  Patient Details:   Name: Gaspar Skeetersvelyn M Tuma DOB: 11-15-1918 MRN: [id number]   Airway Documentation:     Evaluation  O2 sats: stable throughout Complications: No apparent complications Patient did tolerate procedure well. Bilateral Breath Sounds: Clear, Diminished Suctioning: Airway Yes   Patient extubated to 4lnc. Patient tolerated well. No complications. Vital signs stable at this time. RN and family currently at bedside. RT will continue to monitor.  Ave Filterdkins, Shenita Trego Williams 04/14/2014, 9:20 AM

## 2014-04-15 DIAGNOSIS — I5023 Acute on chronic systolic (congestive) heart failure: Secondary | ICD-10-CM

## 2014-04-15 LAB — CBC
HEMATOCRIT: 41.2 % (ref 36.0–46.0)
Hemoglobin: 12.7 g/dL (ref 12.0–15.0)
MCH: 29.3 pg (ref 26.0–34.0)
MCHC: 30.8 g/dL (ref 30.0–36.0)
MCV: 95.2 fL (ref 78.0–100.0)
Platelets: 198 10*3/uL (ref 150–400)
RBC: 4.33 MIL/uL (ref 3.87–5.11)
RDW: 14.1 % (ref 11.5–15.5)
WBC: 15 10*3/uL — ABNORMAL HIGH (ref 4.0–10.5)

## 2014-04-15 LAB — MAGNESIUM: MAGNESIUM: 2 mg/dL (ref 1.5–2.5)

## 2014-04-15 MED ORDER — METOPROLOL TARTRATE 12.5 MG HALF TABLET
12.5000 mg | ORAL_TABLET | Freq: Two times a day (BID) | ORAL | Status: DC
  Administered 2014-04-15 – 2014-04-16 (×3): 12.5 mg via ORAL
  Filled 2014-04-15 (×4): qty 1

## 2014-04-15 MED ORDER — ACETAMINOPHEN 325 MG PO TABS
650.0000 mg | ORAL_TABLET | Freq: Four times a day (QID) | ORAL | Status: DC | PRN
  Administered 2014-04-15: 650 mg via ORAL
  Filled 2014-04-15: qty 2

## 2014-04-15 MED ORDER — DILTIAZEM HCL 30 MG PO TABS
30.0000 mg | ORAL_TABLET | Freq: Four times a day (QID) | ORAL | Status: DC
  Administered 2014-04-15 – 2014-04-16 (×5): 30 mg via ORAL
  Filled 2014-04-15 (×8): qty 1

## 2014-04-15 MED ORDER — FUROSEMIDE 40 MG PO TABS
40.0000 mg | ORAL_TABLET | Freq: Every day | ORAL | Status: DC
  Administered 2014-04-15 – 2014-04-16 (×2): 40 mg via ORAL
  Filled 2014-04-15 (×2): qty 1

## 2014-04-15 MED ORDER — HYDRALAZINE HCL 20 MG/ML IJ SOLN: 10.0000 mg | Freq: Three times a day (TID) | INTRAMUSCULAR | Status: DC | PRN

## 2014-04-15 NOTE — Progress Notes (Addendum)
Subjective:  Successfully extubated. Denies CP/SOB  Objective:  Temp:  [97.8 F (36.6 C)-98.4 F (36.9 C)] 97.8 F (36.6 C) (03/10 0800) Pulse Rate:  [40-147] 127 (03/10 0800) Resp:  [19-30] 20 (03/10 0800) BP: (144-196)/(59-124) 149/113 mmHg (03/10 0800) SpO2:  [88 %-100 %] 97 % (03/10 0800) Weight:  [159 lb 2.8 oz (72.2 kg)] 159 lb 2.8 oz (72.2 kg) (03/10 0500) Weight change: 15 lb 14 oz (7.2 kg)  Intake/Output from previous day: 03/09 0701 - 03/10 0700 In: 1159 [I.V.:1159] Out: 1452 [Urine:1452]  Intake/Output from this shift: Total I/O In: 100 [I.V.:100] Out: -   Physical Exam: General appearance: alert and no distress Neck: no adenopathy, no carotid bruit, no JVD, supple, symmetrical, trachea midline and thyroid not enlarged, symmetric, no tenderness/mass/nodules Lungs: clear to auscultation bilaterally Heart: irregularly irregular rhythm Extremities: extremities normal, atraumatic, no cyanosis or edema  Lab Results: Results for orders placed or performed during the hospital encounter of 04/13/14 (from the past 48 hour(s))  Protime-INR     Status: Abnormal   Collection Time: 04/13/14  1:46 PM  Result Value Ref Range   Prothrombin Time 17.9 (H) 11.6 - 15.2 seconds   INR 1.46 0.00 - 1.49  I-Stat Troponin, ED (not at [facility])     Status: None   Collection Time: 04/13/14  1:51 PM  Result Value Ref Range   Troponin i, poc 0.02 0.00 - 0.08 ng/mL   Comment 3            Comment: Due to the release kinetics of cTnI, a negative result within the first hours of the onset of symptoms does not rule out myocardial infarction with certainty. If myocardial infarction is still suspected, repeat the test at appropriate intervals.   I-Stat Chem 8, ED     Status: Abnormal   Collection Time: 04/13/14  1:53 PM  Result Value Ref Range   Sodium 139 135 - 145 mmol/L   Potassium 7.0 (HH) 3.5 - 5.1 mmol/L   Chloride 104 96 - 112 mmol/L   BUN 33 (H) 6 - 23 mg/dL   Creatinine,  Ser 7.10 (H) 0.50 - 1.10 mg/dL   Glucose, Bld 089 (H) 70 - 99 mg/dL   Calcium, Ion 5.87 1.62 - 1.30 mmol/L   TCO2 23 0 - 100 mmol/L   Hemoglobin 16.0 (H) 12.0 - 15.0 g/dL   HCT 84.4 (H) 55.7 - 69.3 %   Comment NOTIFIED PHYSICIAN   CBC with Differential     Status: Abnormal   Collection Time: 04/13/14  2:05 PM  Result Value Ref Range   WBC 12.0 (H) 4.0 - 10.5 K/uL   RBC 4.73 3.87 - 5.11 MIL/uL   Hemoglobin 14.1 12.0 - 15.0 g/dL   HCT 82.0 97.4 - 10.0 %   MCV 94.1 78.0 - 100.0 fL   MCH 29.8 26.0 - 34.0 pg   MCHC 31.7 30.0 - 36.0 g/dL   RDW 56.5 78.8 - 58.0 %   Platelets 229 150 - 400 K/uL   Neutrophils Relative % 78 (H) 43 - 77 %   Neutro Abs 9.3 (H) 1.7 - 7.7 K/uL   Lymphocytes Relative 17 12 - 46 %   Lymphs Abs 2.0 0.7 - 4.0 K/uL   Monocytes Relative 5 3 - 12 %   Monocytes Absolute 0.6 0.1 - 1.0 K/uL   Eosinophils Relative 0 0 - 5 %   Eosinophils Absolute 0.0 0.0 - 0.7 K/uL   Basophils Relative 0 0 -  1 %   Basophils Absolute 0.0 0.0 - 0.1 K/uL  Potassium     Status: Abnormal   Collection Time: 04/13/14  2:05 PM  Result Value Ref Range   Potassium 6.6 (HH) 3.5 - 5.1 mmol/L    Comment: REPEATED TO VERIFY CRITICAL RESULT CALLED TO, READ BACK BY AND VERIFIED WITH: Barnetta Chapel 03.08.16 1531 M SHIPMAN   I-Stat Chem 8, ED     Status: Abnormal   Collection Time: 04/13/14  2:13 PM  Result Value Ref Range   Sodium 142 135 - 145 mmol/L   Potassium 6.3 (HH) 3.5 - 5.1 mmol/L   Chloride 105 96 - 112 mmol/L   BUN 30 (H) 6 - 23 mg/dL   Creatinine, Ser 5.42 (H) 0.50 - 1.10 mg/dL   Glucose, Bld 715 (H) 70 - 99 mg/dL   Calcium, Ion 6.64 (H) 1.13 - 1.30 mmol/L   TCO2 26 0 - 100 mmol/L   Hemoglobin 13.6 12.0 - 15.0 g/dL   HCT 83.0 32.2 - 01.9 %  I-Stat arterial blood gas, ED     Status: Abnormal   Collection Time: 04/13/14  3:22 PM  Result Value Ref Range   pH, Arterial 7.514 (H) 7.350 - 7.450   pCO2 arterial 35.1 35.0 - 45.0 mmHg   pO2, Arterial 149.0 (H) 80.0 - 100.0 mmHg    Bicarbonate 28.3 (H) 20.0 - 24.0 mEq/L   TCO2 29 0 - 100 mmol/L   O2 Saturation 100.0 %   Acid-Base Excess 5.0 (H) 0.0 - 2.0 mmol/L   Patient temperature 98.6 F    Collection site RADIAL, ALLEN'S TEST ACCEPTABLE    Drawn by RT    Sample type ARTERIAL   Urinalysis, Routine w reflex microscopic     Status: None   Collection Time: 04/13/14  3:40 PM  Result Value Ref Range   Color, Urine YELLOW YELLOW   APPearance CLEAR CLEAR   Specific Gravity, Urine 1.010 1.005 - 1.030   pH 7.5 5.0 - 8.0   Glucose, UA NEGATIVE NEGATIVE mg/dL   Hgb urine dipstick NEGATIVE NEGATIVE   Bilirubin Urine NEGATIVE NEGATIVE   Ketones, ur NEGATIVE NEGATIVE mg/dL   Protein, ur NEGATIVE NEGATIVE mg/dL   Urobilinogen, UA 0.2 0.0 - 1.0 mg/dL   Nitrite NEGATIVE NEGATIVE   Leukocytes, UA NEGATIVE NEGATIVE    Comment: MICROSCOPIC NOT DONE ON URINES WITH NEGATIVE PROTEIN, BLOOD, LEUKOCYTES, NITRITE, OR GLUCOSE <1000 mg/dL.  CBC     Status: Abnormal   Collection Time: 04/13/14  4:33 PM  Result Value Ref Range   WBC 12.4 (H) 4.0 - 10.5 K/uL   RBC 4.70 3.87 - 5.11 MIL/uL   Hemoglobin 14.3 12.0 - 15.0 g/dL   HCT 92.4 15.5 - 16.1 %   MCV 94.0 78.0 - 100.0 fL   MCH 30.4 26.0 - 34.0 pg   MCHC 32.4 30.0 - 36.0 g/dL   RDW 44.3 24.6 - 99.7 %   Platelets 229 150 - 400 K/uL  Creatinine, serum     Status: Abnormal   Collection Time: 04/13/14  4:33 PM  Result Value Ref Range   Creatinine, Ser 1.71 (H) 0.50 - 1.10 mg/dL   GFR calc non Af Amer 25 (L) >90 mL/min   GFR calc Af Amer 29 (L) >90 mL/min    Comment: (NOTE) The eGFR has been calculated using the CKD EPI equation. This calculation has not been validated in all clinical situations. eGFR's persistently <90 mL/min signify possible Chronic Kidney Disease.  Basic metabolic panel     Status: Abnormal   Collection Time: 04/13/14  4:33 PM  Result Value Ref Range   Sodium 143 135 - 145 mmol/L   Potassium 5.6 (H) 3.5 - 5.1 mmol/L   Chloride 106 96 - 112 mmol/L   CO2  26 19 - 32 mmol/L   Glucose, Bld 111 (H) 70 - 99 mg/dL   BUN 29 (H) 6 - 23 mg/dL   Creatinine, Ser 4.92 (H) 0.50 - 1.10 mg/dL   Calcium 01.0 (H) 8.4 - 10.5 mg/dL   GFR calc non Af Amer 25 (L) >90 mL/min   GFR calc Af Amer 29 (L) >90 mL/min    Comment: (NOTE) The eGFR has been calculated using the CKD EPI equation. This calculation has not been validated in all clinical situations. eGFR's persistently <90 mL/min signify possible Chronic Kidney Disease.    Anion gap 11 5 - 15  Troponin I     Status: Abnormal   Collection Time: 04/13/14  4:33 PM  Result Value Ref Range   Troponin I 0.06 (H) <0.031 ng/mL    Comment:        PERSISTENTLY INCREASED TROPONIN VALUES IN THE RANGE OF 0.04-0.49 ng/mL CAN BE SEEN IN:       -UNSTABLE ANGINA       -CONGESTIVE HEART FAILURE       -MYOCARDITIS       -CHEST TRAUMA       -ARRYHTHMIAS       -LATE PRESENTING MYOCARDIAL INFARCTION       -COPD   CLINICAL FOLLOW-UP RECOMMENDED.   Lactic acid, plasma     Status: Abnormal   Collection Time: 04/13/14  4:33 PM  Result Value Ref Range   Lactic Acid, Venous 2.9 (HH) 0.5 - 2.0 mmol/L    Comment: REPEATED TO VERIFY CRITICAL RESULT CALLED TO, READ BACK BY AND VERIFIED WITH: E SMITH,RN 03.08.16 1815 M SHIPMAN   Magnesium     Status: None   Collection Time: 04/13/14  4:33 PM  Result Value Ref Range   Magnesium 2.3 1.5 - 2.5 mg/dL  Phosphorus     Status: None   Collection Time: 04/13/14  4:33 PM  Result Value Ref Range   Phosphorus 2.8 2.3 - 4.6 mg/dL  MRSA PCR Screening     Status: None   Collection Time: 04/13/14  6:14 PM  Result Value Ref Range   MRSA by PCR NEGATIVE NEGATIVE    Comment:        The GeneXpert MRSA Assay (FDA approved for NASAL specimens only), is one component of a comprehensive MRSA colonization surveillance program. It is not intended to diagnose MRSA infection nor to guide or monitor treatment for MRSA infections.   Troponin I     Status: Abnormal   Collection Time:  04/13/14  9:23 PM  Result Value Ref Range   Troponin I 0.05 (H) <0.031 ng/mL    Comment:        PERSISTENTLY INCREASED TROPONIN VALUES IN THE RANGE OF 0.04-0.49 ng/mL CAN BE SEEN IN:       -UNSTABLE ANGINA       -CONGESTIVE HEART FAILURE       -MYOCARDITIS       -CHEST TRAUMA       -ARRYHTHMIAS       -LATE PRESENTING MYOCARDIAL INFARCTION       -COPD   CLINICAL FOLLOW-UP RECOMMENDED.   Basic metabolic panel     Status: Abnormal  Collection Time: 04/13/14  9:23 PM  Result Value Ref Range   Sodium 143 135 - 145 mmol/L   Potassium 4.4 3.5 - 5.1 mmol/L    Comment: DELTA CHECK NOTED   Chloride 103 96 - 112 mmol/L   CO2 32 19 - 32 mmol/L   Glucose, Bld 140 (H) 70 - 99 mg/dL   BUN 27 (H) 6 - 23 mg/dL   Creatinine, Ser 1.75 (H) 0.50 - 1.10 mg/dL   Calcium 10.7 (H) 8.4 - 10.5 mg/dL   GFR calc non Af Amer 24 (L) >90 mL/min   GFR calc Af Amer 28 (L) >90 mL/min    Comment: (NOTE) The eGFR has been calculated using the CKD EPI equation. This calculation has not been validated in all clinical situations. eGFR's persistently <90 mL/min signify possible Chronic Kidney Disease.    Anion gap 8 5 - 15  Troponin I     Status: Abnormal   Collection Time: 04/14/14  3:35 AM  Result Value Ref Range   Troponin I 0.05 (H) <0.031 ng/mL    Comment:        PERSISTENTLY INCREASED TROPONIN VALUES IN THE RANGE OF 0.04-0.49 ng/mL CAN BE SEEN IN:       -UNSTABLE ANGINA       -CONGESTIVE HEART FAILURE       -MYOCARDITIS       -CHEST TRAUMA       -ARRYHTHMIAS       -LATE PRESENTING MYOCARDIAL INFARCTION       -COPD   CLINICAL FOLLOW-UP RECOMMENDED.   CBC     Status: Abnormal   Collection Time: 04/14/14  3:45 AM  Result Value Ref Range   WBC 12.9 (H) 4.0 - 10.5 K/uL   RBC 4.36 3.87 - 5.11 MIL/uL   Hemoglobin 12.8 12.0 - 15.0 g/dL   HCT 40.0 36.0 - 46.0 %   MCV 91.7 78.0 - 100.0 fL   MCH 29.4 26.0 - 34.0 pg   MCHC 32.0 30.0 - 36.0 g/dL   RDW 13.8 11.5 - 15.5 %   Platelets 215 150 - 400  K/uL  Basic metabolic panel     Status: Abnormal   Collection Time: 04/14/14  3:45 AM  Result Value Ref Range   Sodium 142 135 - 145 mmol/L   Potassium 4.1 3.5 - 5.1 mmol/L   Chloride 105 96 - 112 mmol/L   CO2 29 19 - 32 mmol/L   Glucose, Bld 104 (H) 70 - 99 mg/dL   BUN 27 (H) 6 - 23 mg/dL   Creatinine, Ser 1.53 (H) 0.50 - 1.10 mg/dL   Calcium 9.8 8.4 - 10.5 mg/dL   GFR calc non Af Amer 28 (L) >90 mL/min   GFR calc Af Amer 33 (L) >90 mL/min    Comment: (NOTE) The eGFR has been calculated using the CKD EPI equation. This calculation has not been validated in all clinical situations. eGFR's persistently <90 mL/min signify possible Chronic Kidney Disease.    Anion gap 8 5 - 15  Magnesium     Status: None   Collection Time: 04/14/14  3:45 AM  Result Value Ref Range   Magnesium 2.2 1.5 - 2.5 mg/dL  Phosphorus     Status: None   Collection Time: 04/14/14  3:45 AM  Result Value Ref Range   Phosphorus 2.5 2.3 - 4.6 mg/dL  Glucose, capillary     Status: Abnormal   Collection Time: 04/14/14  8:00 AM  Result Value Ref Range  Glucose-Capillary 119 (H) 70 - 99 mg/dL   Comment 1 Notify RN   Basic metabolic panel     Status: Abnormal   Collection Time: 04/14/14  6:54 PM  Result Value Ref Range   Sodium 144 135 - 145 mmol/L   Potassium 4.1 3.5 - 5.1 mmol/L   Chloride 105 96 - 112 mmol/L   CO2 29 19 - 32 mmol/L   Glucose, Bld 142 (H) 70 - 99 mg/dL   BUN 25 (H) 6 - 23 mg/dL   Creatinine, Ser 1.44 (H) 0.50 - 1.10 mg/dL   Calcium 9.6 8.4 - 10.5 mg/dL   GFR calc non Af Amer 30 (L) >90 mL/min   GFR calc Af Amer 35 (L) >90 mL/min    Comment: (NOTE) The eGFR has been calculated using the CKD EPI equation. This calculation has not been validated in all clinical situations. eGFR's persistently <90 mL/min signify possible Chronic Kidney Disease.    Anion gap 10 5 - 15  CBC     Status: Abnormal   Collection Time: 04/15/14  5:20 AM  Result Value Ref Range   WBC 15.0 (H) 4.0 - 10.5 K/uL     RBC 4.33 3.87 - 5.11 MIL/uL   Hemoglobin 12.7 12.0 - 15.0 g/dL   HCT 41.2 36.0 - 46.0 %   MCV 95.2 78.0 - 100.0 fL   MCH 29.3 26.0 - 34.0 pg   MCHC 30.8 30.0 - 36.0 g/dL   RDW 14.1 11.5 - 15.5 %   Platelets 198 150 - 400 K/uL  Magnesium     Status: None   Collection Time: 04/15/14  5:20 AM  Result Value Ref Range   Magnesium 2.0 1.5 - 2.5 mg/dL    Imaging: Imaging results have been reviewed  Tele- AFIB with RVR  Assessment/Plan:   1. Principal Problem: 2.   Symptomatic bradycardia 3. Active Problems: 4.   Hypertension 5.   CHF, secondary to rapid AF and diastolic dysfunction. EF 45-50% 6.   Chronic anticoagulation 7.   ?TIA on medication, while Coumadin theraputic 8.   Chronic atrial fibrillation 9.   Hyperkalemia 10.   Acute renal failure superimposed on stage 3 chronic kidney disease 11.   Complete heart block 12.   Time Spent Directly with Patient:  20 minutes  Length of Stay:  LOS: 2 days   1. CAF- Pt admitted with CHB, SVR AF in setting of worsening RI and hyperkalemia. K was treated. Now nl. Neg Chrono held, now tachy. Dilt 30 mg PO q6 hrs started. Will re start low dose BB. Eliquis restarted as well. D/C ASA. Can prob consolidate CCB to Cardizem CD 180 once we see her HR response (was on 240 mg at home). Tx tele.  2. Acute on Chronic RI- SCr slowly declining 1.7----> 1.4. Restart lasix q day instead of BID. Follow  3. Hyperkalemia- Approp Rx, now nl. Hold K replacement  4. HTN- BP elevated prob secondary to holding home meds. Will Restart at lower dose. PRN IV Hydralazine   BERRY,JONATHAN J 04/15/2014, 9:09 AM

## 2014-04-15 NOTE — Progress Notes (Signed)
PULMONARY / CRITICAL CARE MEDICINE   Name: [name] MRN: [id number] DOB: 05/07/1918    ADMISSION DATE:  04/13/2014 CONSULTATION DATE:  04/13/2014  REFERRING MD :  Antonieta Loveless  CHIEF COMPLAINT:  Bradycardia  INITIAL PRESENTATION: 81 y/o F w/ PMHx of chronic atrial fibrillation, chronic combined CHF, asthma, and CKD stage 3, presented to ED 3/8 after syncopal episode at home. In ED she was found to be bradycardic (20) in complete heart block. Intubated for airway protection and transcutaneously paced in ED. Hyperkalemic, treated in ED. PCCM to admit.   STUDIES:  CXR 3/9 >> NACPD EKG 3/9 >> Atrial Fibrillation, rate controlled.   SIGNIFICANT EVENTS: 3/8 - Syncopal, Complete heart block, intubated, to ICU.  SUBJECTIVE: Doing well this AM, describing some palpitations and mild chest tightness. No SOB, or lightheadedness.   VITAL SIGNS: Temp:  [98 F (36.7 C)-98.5 F (36.9 C)] 98 F (36.7 C) (03/10 0400) Pulse Rate:  [40-147] 89 (03/10 0645) Resp:  [18-30] 25 (03/10 0645) BP: (144-196)/(43-124) 157/65 mmHg (03/10 0645) SpO2:  [88 %-100 %] 97 % (03/10 0645) FiO2 (%):  [40 %] 40 % (03/09 0827) Weight:  [159 lb 2.8 oz (72.2 kg)] 159 lb 2.8 oz (72.2 kg) (03/10 0500)    INTAKE / OUTPUT:  Intake/Output Summary (Last 24 hours) at 04/15/14 0725 Last data filed at 04/15/14 0600  Gross per 24 hour  Intake   1159 ml  Output   1452 ml  Net   -293 ml    PHYSICAL EXAMINATION: General:  Elderly white female, alert, in NAD. Neuro: Alert and oriented. Moves all extremities spontaneously. No focal deficits.  HEENT: Neck supple, no lymphadenopathy.  Cardiovascular: Tachycardic, irregular rhythm, no obvious murmurs, gallops, or rubs.  Lungs: Lungs clear bilaterally. No wheezes or rales.  Abdomen:  Soft, non-tender, non-distended, BS + Musculoskeletal:  No acute deformity. No edema.  Skin: Grossly intact, no erythema or rashes.   LABS:  CBC  Recent Labs Lab 04/13/14 1633  04/14/14 0345 04/15/14 0520  WBC 12.4* 12.9* 15.0*  HGB 14.3 12.8 12.7  HCT 44.2 40.0 41.2  PLT 229 215 198   Coag's  Recent Labs Lab 04/13/14 1346  INR 1.46   BMET  Recent Labs Lab 04/13/14 2123 04/14/14 0345 04/14/14 1854  NA 143 142 144  K 4.4 4.1 4.1  CL 103 105 105  CO2 32 29 29  BUN 27* 27* 25*  CREATININE 1.75* 1.53* 1.44*  GLUCOSE 140* 104* 142*   Electrolytes  Recent Labs Lab 04/13/14 1633 04/13/14 2123 04/14/14 0345 04/14/14 1854 04/15/14 0520  CALCIUM 11.2* 10.7* 9.8 9.6  --   MG 2.3  --  2.2  --  2.0  PHOS 2.8  --  2.5  --   --    Sepsis Markers  Recent Labs Lab 04/13/14 1633  LATICACIDVEN 2.9*   ABG  Recent Labs Lab 04/13/14 1522  PHART 7.514*  PCO2ART 35.1  PO2ART 149.0*   Cardiac Enzymes  Recent Labs Lab 04/13/14 1633 04/13/14 2123 04/14/14 0335  TROPONINI 0.06* 0.05* 0.05*    Imaging Dg Chest Port 1 View  04/14/2014   CLINICAL DATA:  Evaluate endotracheal tube position  EXAM: PORTABLE CHEST - 1 VIEW  COMPARISON:  Portable chest x-ray of 04/13/2014  FINDINGS: the tip of the endotracheal tube is approximately 4.9 cm above the carina. The lungs remain well aerated. No focal infiltrate or effusion is seen. Mild cardiomegaly is stable. NG tube extends below the hemidiaphragm with the  tip apparently in the fundus.  IMPRESSION: 1. Endotracheal tube tip 4.9 cm above the carina. 2. The lungs appear well aerated.   Electronically Signed   By: Dwyane DeePaul  Barry M.D.   On: 04/14/2014 07:23    ASSESSMENT / PLAN:  PULMONARY OETT 3/8 >>> A: Acute respiratory failure 2/2 acute encephalopathy; resolved P:   Continue to monitor clinically Supplemental O2 prn  CARDIOVASCULAR A:  Complete Heart Block; most likely 2/2 hyperkalemia; resolved Chronic Combined CHF (2013 - LVEF 45-50%) Chronic Atrial Fibrillation on Eliquis; RVR this AM HTN ?BB/CCB unintentional overdose P:  Telemetry monitoring Restart Diltiazem 30 mg q6h PRN  hydralazine Cardiology following Eliquis  RENAL A:   AKI (baseline creat 1.2); continues to improve Hyperkalemia; resolved.  P:   BMP q12h KVO IVF Hold home Lasix, K supplementation  GASTROINTESTINAL A:   No acute issues GI PPx P:   Protonix NPO; SLP eval this AM  HEMATOLOGIC A: Leukocytosis   Chronic anticoagulation with Eliquis for AF  P:  Follow CBC Eliquis  INFECTIOUS A:   Leukocytosis; increasing P:   Monitor CBC Consider repeat CXR  ENDOCRINE A:   No acute issues P:   Follow glucose on BMP  NEUROLOGIC A:   Acute metabolic encephalopathy; resolved.  P:   Monitor clinically.   FAMILY  - Updates: Daughter updated at bedside 04/14/14  - Inter-disciplinary family meet or Palliative Care meeting due by:  3/15    Lauris ChromanWoody Jones, MD PGY-2, Internal Medicine Pager: [phone]   STAFF NOTE: Cindi CarbonI, Daniel Feinstein, MD FACP have personally reviewed patient's available data, including medical history, events of note, physical examination and test results as part of my evaluation. I have discussed with resident/NP and other care providers such as pharmacist, RN and RRT. In addition, I personally evaluated patient and elicited key findings of: hemoconcentrated slight, had sligh ne balance, no concerns wbc, swallow eval, to tele, to tele, await am labs back, k has been resolving, no distress, start cardizem, BB hhome dose half agree  Mcarthur Rossettianiel J. Tyson AliasFeinstein, MD, FACP Pgr: [phone]  Pulmonary & Critical Care 04/15/2014 9:16 AM

## 2014-04-15 NOTE — Evaluation (Signed)
Clinical/Bedside Swallow Evaluation Patient Details  Name: [name] MRN: [id number] Date of Birth: May 19, 1918  Today's Date: 04/15/2014 Time: SLP Start Time (ACUTE ONLY): 0859 SLP Stop Time (ACUTE ONLY): 0909 SLP Time Calculation (min) (ACUTE ONLY): 10 min  Past Medical History:  Past Medical History  Diagnosis Date  . Hypertension   . Chronic atrial fibrillation   . Arthritis   . Chronic combined systolic and diastolic CHF (congestive heart failure)     a. Dx 2013, EF 45-50%, felt due in part to rapid AF.  Marland Kitchen. Asthma   . Hypokalemia   . TIA (transient ischemic attack)     a. Question TIA during 2013 admission with word finding difficulty, already on Coumadin at the time.  . CKD (chronic kidney disease), stage III     a. Per review of chart, likely CKD stage III in 2013.Marland Kitchen.   Past Surgical History:  Past Surgical History  Procedure Laterality Date  . Other surgical history      bilateral hip replacement  . Joint replacement      bilateral hip replacement   HPI:  Pt is a 81 y/o female who presented to ED 3/8 after syncopal episode at home. In ED she was found to be bradycardic (20) in CHB. Intubated (04/13/14 for airway protection and transcutaneously paced in ED; extubated on 04/14/14. Hyperkalemic, treated in ED. PCCM to admit.    Assessment / Plan / Recommendation Clinical Impression   Pt intermittently coughed following small sips of thin liquids, however, no changes observed in respiratory rate during PO trials, no current PNA, good vocal quality and short intubation period (<24hrs), do not suspect due to aspiration. PO trials with solid consistencies did not elicit s/s of aspiration. Recommend pt continue reg / thin liquid diet. Speech will follow-up for diet tolerance.     Aspiration Risk  Mild    Diet Recommendation Regular;Thin liquid   Liquid Administration via: Cup;No straw Medication Administration: Whole meds with puree Supervision: Patient able to self  feed Compensations: Slow rate;Small sips/bites Postural Changes and/or Swallow Maneuvers: Seated upright 90 degrees    Other  Recommendations Oral Care Recommendations: Oral care BID   Follow Up Recommendations       Frequency and Duration min 2x/week  2 weeks   Pertinent Vitals/Pain     SLP Swallow Goals     Swallow Study Prior Functional Status       General HPI: Pt is a 81 y/o female who presented to ED 3/8 after syncopal episode at home. In ED she was found to be bradycardic (20) in CHB. Intubated (04/13/14 for airway protection and transcutaneously paced in ED; extubated on 04/14/14. Hyperkalemic, treated in ED. PCCM to admit.  Type of Study: Bedside swallow evaluation Diet Prior to this Study: Regular;Thin liquids Temperature Spikes Noted: No Respiratory Status: Nasal cannula History of Recent Intubation: Yes Length of Intubations (days): 1 days Date extubated: 04/14/14 Behavior/Cognition: Alert;Cooperative;Pleasant mood Oral Cavity - Dentition: Adequate natural dentition Self-Feeding Abilities: Able to feed self Patient Positioning: Upright in bed Baseline Vocal Quality: Clear    Oral/Motor/Sensory Function Overall Oral Motor/Sensory Function: Appears within functional limits for tasks assessed Labial ROM: Within Functional Limits Labial Symmetry: Within Functional Limits Lingual ROM: Within Functional Limits Lingual Symmetry: Within Functional Limits Facial ROM: Within Functional Limits   Ice Chips Ice chips: Not tested   Thin Liquid Thin Liquid: Within functional limits    Nectar Thick Nectar Thick Liquid: Not tested   Honey Thick Honey  Thick Liquid: Not tested   Puree Puree: Not tested   Solid   GO    Solid: Within functional limits       Bermudez-Bosch, Donetta Isaza 04/15/2014,9:17 AM

## 2014-04-15 NOTE — Progress Notes (Signed)
CSW (Clinical Child psychotherapistocial Worker) aware pt admitted [facility] and facility is holding SNF bed if needed at dc. CSW visited pt room but pt in deep sleep. Pt friend at bedside and asked that CSW come back later. CSW full assessment will follow when pt available.  Nasira Janusz, LCSWA [phone]

## 2014-04-16 DIAGNOSIS — Z7901 Long term (current) use of anticoagulants: Secondary | ICD-10-CM

## 2014-04-16 LAB — BASIC METABOLIC PANEL
Anion gap: 10 (ref 5–15)
BUN: 15 mg/dL (ref 6–23)
CALCIUM: 9.3 mg/dL (ref 8.4–10.5)
CO2: 30 mmol/L (ref 19–32)
CREATININE: 1.06 mg/dL (ref 0.50–1.10)
Chloride: 102 mmol/L (ref 96–112)
GFR calc Af Amer: 51 mL/min — ABNORMAL LOW (ref >90)
GFR, EST NON AFRICAN AMERICAN: 44 mL/min — AB (ref >90)
GLUCOSE: 123 mg/dL — AB (ref 70–99)
Potassium: 2.7 mmol/L — CL (ref 3.5–5.1)
SODIUM: 142 mmol/L (ref 135–145)

## 2014-04-16 MED ORDER — FUROSEMIDE 40 MG PO TABS: 40.0000 mg | ORAL_TABLET | Freq: Every day | ORAL | Status: DC

## 2014-04-16 MED ORDER — DILTIAZEM HCL ER COATED BEADS 180 MG PO CP24
180.0000 mg | ORAL_CAPSULE | Freq: Every day | ORAL | Status: DC
  Administered 2014-04-16: 180 mg via ORAL
  Filled 2014-04-16: qty 1

## 2014-04-16 MED ORDER — ALUM & MAG HYDROXIDE-SIMETH 200-200-20 MG/5ML PO SUSP: 30.0000 mL | ORAL | Status: AC | PRN

## 2014-04-16 MED ORDER — DILTIAZEM HCL ER COATED BEADS 180 MG PO CP24: 180.0000 mg | ORAL_CAPSULE | Freq: Every day | ORAL | Status: AC

## 2014-04-16 MED ORDER — HYDROCODONE-ACETAMINOPHEN 5-500 MG PO TABS: 1.0000 | ORAL_TABLET | Freq: Four times a day (QID) | ORAL | Status: DC | PRN

## 2014-04-16 MED ORDER — CETYLPYRIDINIUM CHLORIDE 0.05 % MT LIQD: 7.0000 mL | Freq: Two times a day (BID) | OROMUCOSAL | Status: DC

## 2014-04-16 MED ORDER — POTASSIUM CHLORIDE CRYS ER 20 MEQ PO TBCR
40.0000 meq | EXTENDED_RELEASE_TABLET | Freq: Once | ORAL | Status: AC
  Administered 2014-04-16: 40 meq via ORAL
  Filled 2014-04-16: qty 2

## 2014-04-16 MED ORDER — ACETAMINOPHEN 325 MG PO TABS: 650.0000 mg | ORAL_TABLET | Freq: Four times a day (QID) | ORAL | Status: AC | PRN

## 2014-04-16 MED ORDER — ALUM & MAG HYDROXIDE-SIMETH 200-200-20 MG/5ML PO SUSP
30.0000 mL | ORAL | Status: DC | PRN
  Administered 2014-04-16: 30 mL via ORAL
  Filled 2014-04-16 (×2): qty 30

## 2014-04-16 MED ORDER — METOPROLOL TARTRATE 25 MG PO TABS: 12.5000 mg | ORAL_TABLET | Freq: Two times a day (BID) | ORAL | Status: AC

## 2014-04-16 MED ORDER — ALBUTEROL SULFATE (2.5 MG/3ML) 0.083% IN NEBU
2.5000 mg | INHALATION_SOLUTION | Freq: Once | RESPIRATORY_TRACT | Status: AC
  Administered 2014-04-16: 2.5 mg via RESPIRATORY_TRACT
  Filled 2014-04-16: qty 3

## 2014-04-16 NOTE — Progress Notes (Signed)
I spoke with patients OptometristGranddaughter Amber who initially requested that I make a follow up appt with Dr. Pete GlatterStoneking but later called back stating [facility].

## 2014-04-16 NOTE — Progress Notes (Signed)
eLink Physician-Brief Progress Note Patient Name: [name] DOB: 20-Jan-1919 MRN: [id number]   Date of Service  04/16/2014  HPI/Events of Note  Called regarding dyspnea and wheeze. RN gives a good description of what sounds like UA noise - no real secretions. Suspect due to recent extubation. May need epi or cool mist, possibly even steroids - will follow. For now will try albuterol to see if she responds.   eICU Interventions       Intervention Category Intermediate Interventions: Respiratory distress - evaluation and management  Johnnell Liou S. 04/16/2014, 2:17 AM

## 2014-04-16 NOTE — Progress Notes (Signed)
[facility]. Family at bedside and expained d/c instructions with them

## 2014-04-16 NOTE — Progress Notes (Signed)
Pt c/o indigestion. MD on call paged to request order for Maalox. Awaiting return call. VSS, will continue to monitor closely.

## 2014-04-16 NOTE — Discharge Summary (Addendum)
Physician Discharge Summary  [name] OIB:[id number] DOB: Sep 04, 1918 DOA: 04/13/2014  PCP: Leonides Sake, MD  Admit date: 04/13/2014 Discharge date: 04/16/2014  Recommendations for Outpatient Follow-up:  1. Per cardiology, continue Cardizem 180 mg daily in addition to low-dose metoprolol 12.5 mg twice daily. 2. Continue Eliquis age and renally adjusted 3. Continue potassium supplementation. Recheck be met in the next 2-3 days on discharge to make sure potassium is within normal limits. 4. Please have palliative care services follow the patient in nursing home to address goals of care. 5. Continue to monitor creatinine since patient is on Lasix.   Discharge Diagnoses:  Principal Problem:   Symptomatic bradycardia Active Problems:   Hypertension   CHF, secondary to rapid AF and diastolic dysfunction. EF 45-50%   Chronic anticoagulation   ?TIA on medication, while Coumadin theraputic   Chronic atrial fibrillation   Hyperkalemia   Acute renal failure superimposed on stage 3 chronic kidney disease   Complete heart block    Discharge Condition: stable   Diet recommendation: Regular;Thin liquid  Liquid Administration via: Cup;No straw Medication Administration: Whole meds with puree Supervision: Patient able to self feed Compensations: Slow rate;Small sips/bites   Postural Changes and/or Swallow Maneuvers: Seated upright 90 degrees   History of present illness:  81 year old with history of chronic atrial fibrillation, chronic combined CHF, asthma, chronic kidney disease stage III who presented to [facility] on 04/13/2014 status post syncope at home. In ED, patient was found to have bradycardia with heart rate in 20s incomplete heart block. She was intubated for airway protection and transcutaneously paced in ED. Hospital course complicated with hyperkalemia and this was treated in emergency department. Patient is now stable. Critical care was the primary team through  04/15/2014. [facility] assumed care 04/16/2014.   STUDIES:  CXR 3/9 >> NACPD EKG 3/9 >> Atrial Fibrillation, rate controlled.   SIGNIFICANT EVENTS: 3/8 - Syncopal, Complete heart block, intubated, to ICU.   Hospital Course:  Principal Problem:   Symptomatic bradycardia Active Problems:   Hypertension   CHF, secondary to rapid AF and diastolic dysfunction. EF 45-50%   Chronic anticoagulation   ?TIA on medication, while Coumadin theraputic   Chronic atrial fibrillation   Hyperkalemia   Acute renal failure superimposed on stage 3 chronic kidney disease   Complete heart block   ASSESSMENT / PLAN:  Acute respiratory failure with hypoxia secondary to encephalopathy - Patient required intubation almost a since admission for airway protection. - Encephalopathy likely secondary to severe bradycardia - Mental status better this morning. - Patient is stable for discharge to skilled nursing facility today. - Recommend palliative care services to follow the patient in skilled nursing facility to address goals of care.  Complete heart block secondary to hyperkalemia / combined systolic and diastolic congestive heart failure / Bradycardia - Patient transcutaneously paced in ED. Hyperkalemia resolved. Since she is on Lasix now she is hypokalemic. - Order placed for potassium supplementation on discharge. Patient also given potassium prior to discharge. - 2-D echo in 2013 showed ejection fraction of 45-50% - Cardiology has seen the patient in consultation. Recommend continuing Lasix 40 mg daily. - As already mentioned patient would benefit from palliative care services to address goals of care in future.  Chronic atrial fibrillation - On anticoagulation with apixaban. - Patient is currently on Cardizem and low dose metoprolol for heart rate control.  Acute renal failure - Likely secondary to hyperkalemia, acute illness - Creatinine is 1.53. Continue to monitor renal  function while patient  is on Lasix. As already mentioned, palliative care needs to be involved for goals of care.  Leukocytosis - Likely reactive. No acute findings on chest x-ray.   SignedLeisa Lenz, MD  [facility] 04/16/2014, 10:22 AM  Pager #: [phone]   Discharge Exam: Filed Vitals:   04/16/14 0953  BP: 180/80  Pulse: 103  Temp:   Resp:    Filed Vitals:   04/16/14 0252 04/16/14 0500 04/16/14 0952 04/16/14 0953  BP:  139/83 140/80 180/80  Pulse:  107  103  Temp:  98.4 F (36.9 C)    TempSrc:  Oral    Resp:      Height:  5' 2.99" (1.6 m)    Weight:  70.716 kg (155 lb 14.4 oz)    SpO2: 97% 96%      General: Pt is not in acute distress Cardiovascular: Regular rate and rhythm, S1/S2 appreciated Respiratory: Bilateral air entry, no wheezing Abdominal: Soft, non tender, non distended, bowel sounds +, no guarding Extremities: Pulses palpable, no edema Neuro: Grossly nonfocal  Discharge Instructions  Discharge Instructions    Call MD for:  difficulty breathing, headache or visual disturbances    Complete by:  As directed      Call MD for:  persistant nausea and vomiting    Complete by:  As directed      Call MD for:  severe uncontrolled pain    Complete by:  As directed      Diet - low sodium heart healthy    Complete by:  As directed        Discharge instructions    Complete by:  As directed   Please note changes in blood pressure meds. Per cardio ok that HR in low 110's. Follow up with cardio per sch appt. Would recommend palliative care services to see the patient in hospital to discuss goals of care.     Increase activity slowly    Complete by:  As directed             Medication List    STOP taking these medications        magnesium oxide 400 MG tablet  Commonly known as:  MAG-OX      TAKE these medications        acetaminophen 325 MG tablet  Commonly known as:  TYLENOL  Take 2 tablets (650 mg total) by mouth every 6 (six) hours as needed for  headache.     alum & mag hydroxide-simeth 200-200-20 MG/5ML suspension  Commonly known as:  MAALOX/MYLANTA  Take 30 mLs by mouth every 4 (four) hours as needed for indigestion or heartburn.     antiseptic oral rinse 0.05 % Liqd solution  Commonly known as:  CPC / CETYLPYRIDINIUM CHLORIDE 0.05%  7 mLs by Mouth Rinse route 2 (two) times daily.     apixaban 2.5 MG Tabs tablet  Commonly known as:  ELIQUIS  Take 2.5 mg by mouth 2 (two) times daily.     diltiazem 180 MG 24 hr capsule  Commonly known as:  CARDIZEM CD  Take 1 capsule (180 mg total) by mouth daily.     furosemide 40 MG tablet  Commonly known as:  LASIX  Take 1 tablet (40 mg total) by mouth daily.     HYDROcodone-acetaminophen 5-500 MG per tablet  Commonly known as:  VICODIN  Take 1 tablet by mouth every 6 (six) hours as needed for pain.  metoprolol tartrate 25 MG tablet  Commonly known as:  LOPRESSOR  Take 0.5 tablets (12.5 mg total) by mouth 2 (two) times daily.     oyster calcium 500 MG Tabs tablet  Take 500 mg of elemental calcium by mouth 2 (two) times daily.     potassium chloride SA 20 MEQ tablet  Commonly known as:  K-DUR,KLOR-CON  Take 20 mEq by mouth 2 (two) times daily.     prednisoLONE acetate 1 % ophthalmic suspension  Commonly known as:  PRED FORTE  Place 1 drop into both eyes 4 (four) times daily.     tiotropium 18 MCG inhalation capsule  Commonly known as:  SPIRIVA  Place 18 mcg into inhaler and inhale daily as needed (shortness of breath, wheezing).           Follow-up Information    Follow up with [facility], MD. Schedule an appointment as soon as possible for a visit in 1 week.   Specialty:  Family Medicine   Why:  Follow up appt after recent hospitalization   Contact information:   Dr. Daiva Eves [address] [place] [address] [phone]        The results of significant diagnostics from this hospitalization (including imaging, microbiology, ancillary  and laboratory) are listed below for reference.    Significant Diagnostic Studies: Dg Chest Port 1 View  04/14/2014   CLINICAL DATA:  Evaluate endotracheal tube position  EXAM: PORTABLE CHEST - 1 VIEW  COMPARISON:  Portable chest x-ray of 04/13/2014  FINDINGS: the tip of the endotracheal tube is approximately 4.9 cm above the carina. The lungs remain well aerated. No focal infiltrate or effusion is seen. Mild cardiomegaly is stable. NG tube extends below the hemidiaphragm with the tip apparently in the fundus.  IMPRESSION: 1. Endotracheal tube tip 4.9 cm above the carina. 2. The lungs appear well aerated.   Electronically Signed   By: Ivar Drape M.D.   On: 04/14/2014 07:23   Dg Chest Portable 1 View  04/13/2014   CLINICAL DATA:  81 year old female status post intubation and nasogastric tube placement  EXAM: PORTABLE CHEST - 1 VIEW  COMPARISON:  Prior chest x-ray 04/13/2014  FINDINGS: The endotracheal tube is 4.2 cm above the carina. The tip of the nasogastric tube is not imaged but falls below the diaphragm presumably within the stomach. Stable borderline cardiomegaly. Atherosclerotic calcification present within the transverse aorta. No change in the appearance of the lungs. Persistent bronchitic change. Is no acute osseous abnormality.  IMPRESSION: 1. The tip of the endotracheal tube is 4.2 cm above the carina. 2. The tip of the nasogastric tube lies off the field of view, below the diaphragm and presumably within the stomach. 3. No interval change in the appearance of the lungs were chest.   Electronically Signed   By: Jacqulynn Cadet M.D.   On: 04/13/2014 14:39   Dg Chest Portable 1 View  04/13/2014   CLINICAL DATA:  Bradycardia  EXAM: PORTABLE CHEST - 1 VIEW  COMPARISON:  03/19/2011  FINDINGS: Cardiac shadow remains mildly enlarged. The lungs are well aerated bilaterally. No focal infiltrate or sizable effusion is seen. No acute bony abnormality is seen.  IMPRESSION: No acute abnormality noted.    Electronically Signed   By: Inez Catalina M.D.   On: 04/13/2014 14:02    Microbiology: Recent Results (from the past 240 hour(s))  MRSA PCR Screening     Status: None   Collection Time: 04/13/14  6:14 PM  Result Value Ref Range Status   MRSA by PCR NEGATIVE NEGATIVE Final    Comment:        The GeneXpert MRSA Assay (FDA approved for NASAL specimens only), is one component of a comprehensive MRSA colonization surveillance program. It is not intended to diagnose MRSA infection nor to guide or monitor treatment for MRSA infections.      Labs: Basic Metabolic Panel:  Recent Labs Lab 04/13/14 1633 04/13/14 2123 04/14/14 0345 04/14/14 1854 04/15/14 0520 04/16/14 0505  NA 143 143 142 144  --  142  K 5.6* 4.4 4.1 4.1  --  2.7*  CL 106 103 105 105  --  102  CO2 26 32 29 29  --  30  GLUCOSE 111* 140* 104* 142*  --  123*  BUN 29* 27* 27* 25*  --  15  CREATININE 1.71*  1.71* 1.75* 1.53* 1.44*  --  1.06  CALCIUM 11.2* 10.7* 9.8 9.6  --  9.3  MG 2.3  --  2.2  --  2.0  --   PHOS 2.8  --  2.5  --   --   --    Liver Function Tests: No results for input(s): AST, ALT, ALKPHOS, BILITOT, PROT, ALBUMIN in the last 168 hours. No results for input(s): LIPASE, AMYLASE in the last 168 hours. No results for input(s): AMMONIA in the last 168 hours. CBC:  Recent Labs Lab 04/13/14 1405 04/13/14 1413 04/13/14 1633 04/14/14 0345 04/15/14 0520  WBC 12.0*  --  12.4* 12.9* 15.0*  NEUTROABS 9.3*  --   --   --   --   HGB 14.1 13.6 14.3 12.8 12.7  HCT 44.5 40.0 44.2 40.0 41.2  MCV 94.1  --  94.0 91.7 95.2  PLT 229  --  229 215 198   Cardiac Enzymes:  Recent Labs Lab 04/13/14 1633 04/13/14 2123 04/14/14 0335  TROPONINI 0.06* 0.05* 0.05*   BNP: BNP (last 3 results) No results for input(s): BNP in the last 8760 hours.  ProBNP (last 3 results) No results for input(s): PROBNP in the last 8760 hours.  CBG:  Recent Labs Lab 04/14/14 0800  GLUCAP 119*    Time coordinating  discharge: Over 30 minutes

## 2014-04-16 NOTE — Progress Notes (Signed)
    Subjective:  No complaints. No CP, no SOB.   Objective:  Vital Signs in the last 24 hours: Temp:  [98.2 F (36.8 C)-98.4 F (36.9 C)] 98.4 F (36.9 C) (03/11 0500) Pulse Rate:  [89-120] 107 (03/11 0500) Resp:  [20-22] 22 (03/10 1100) BP: (119-176)/(75-96) 139/83 mmHg (03/11 0500) SpO2:  [89 %-97 %] 96 % (03/11 0500) Weight:  [155 lb 14.4 oz (70.716 kg)] 155 lb 14.4 oz (70.716 kg) (03/11 0500)  Intake/Output from previous day: 03/10 0701 - 03/11 0700 In: 460 [P.O.:360; I.V.:100] Out: 500 [Urine:500]   Physical Exam: General: Elderly, in bed in no acute distress. Head:  Normocephalic and atraumatic. Lungs: Clear to auscultation and percussion. Heart: Irreg irreg mildly tachy.  No murmur, rubs or gallops.  Abdomen: soft, non-tender, positive bowel sounds. Extremities: No clubbing or cyanosis. No edema. Neurologic: Alert.    Lab Results:  Recent Labs  04/14/14 0345 04/15/14 0520  WBC 12.9* 15.0*  HGB 12.8 12.7  PLT 215 198    Recent Labs  04/14/14 1854 04/16/14 0505  NA 144 142  K 4.1 2.7*  CL 105 102  CO2 29 30  GLUCOSE 142* 123*  BUN 25* 15  CREATININE 1.44* 1.06    Recent Labs  04/13/14 2123 04/14/14 0335  TROPONINI 0.05* 0.05*     Telemetry: AFIB 100-110 Personally viewed.   Scheduled Meds: . antiseptic oral rinse  7 mL Mouth Rinse BID  . apixaban  2.5 mg Oral BID  . diltiazem  30 mg Oral 4 times per day  . fentaNYL  50 mcg Intravenous Once  . furosemide  40 mg Oral Daily  . metoprolol tartrate  12.5 mg Oral BID  . potassium chloride  40 mEq Oral Once   Continuous Infusions: . sodium chloride 50 mL/hr at 04/15/14 1459   PRN Meds:.sodium chloride, acetaminophen, alum & mag hydroxide-simeth, hydrALAZINE, midazolam, midazolam   Assessment/Plan:  Principal Problem:   Symptomatic bradycardia Active Problems:   Hypertension   CHF, secondary to rapid AF and diastolic dysfunction. EF 45-50%   Chronic anticoagulation   ?TIA on  medication, while Coumadin theraputic   Chronic atrial fibrillation   Hyperkalemia   Acute renal failure superimposed on stage 3 chronic kidney disease   Complete heart block  81 year old with severe bradycardia in the setting of potassium of 7, AKI, with persistent AFIB, on chronic anticoagulation.   1) AFIB  - currently on dilt 30 Q6 (120 total)  - HR mildly elevated with AFIB at rest  - I will change to Dilt CD 180 QD.   - Gently add more AV nodal blocking agent in future if needed.  - Continue with low dose metoprolol 12.5 BID   2) Chronic anticoagulation   - low dose Eliquis, age and renal adjusted  3) Prior severe bradycardia  - resolved after resolution of hyperkalemia  4) Chronic diastolic heart failure  - on lasix 40 BID at home. OK with 40 QD now.   - KCL add back as necessary  5) AKI  - resolved, creat 1.7 now 1.0  I would be OK with her heart rate AFIB 100-110 range Would recommend checking BMET early [facility].   I will set up for outpatient follow up in 7 days.     Flay Ghosh 04/16/2014, 9:24 AM

## 2014-04-16 NOTE — Progress Notes (Addendum)
CSW (Clinical Social Worker) prepared pt dc packet and placed with shadow chart. CSW arranged non-emergent ambulance transport. Pt, pt nurse, and facility informed. Pt declined for CSW to notify family/friend. CSW signing off.  Eldrick Penick, LCSWA [phone]  

## 2014-04-16 NOTE — Progress Notes (Signed)
Speech Language Pathology Treatment: Dysphagia  Patient Details Name: [name] MRN: [id number] DOB: 1919/01/26 Today's Date: 04/16/2014 Time: 2633-3545 SLP Time Calculation (min) (ACUTE ONLY): 12 min  Assessment / Plan / Recommendation Clinical Impression  SLP provided skilled observation with regular textures and thin liquids via straw sips. No overt signs of aspiration observed. Pt's voice is clear, and appears to be at baseline. Pt and daughter are without concerns at this time and have been educated on recommendations. No further SLP f/u recommended at this time.   HPI HPI: Pt is a 81 y/o female who presented to ED 3/8 after syncopal episode at home. In ED she was found to be bradycardic (20) in CHB. Intubated (04/13/14 for airway protection and transcutaneously paced in ED; extubated on 04/14/14. Hyperkalemic, treated in ED. PCCM to admit.    Pertinent Vitals Pain Assessment: No/denies pain  SLP Plan  All goals met    Recommendations Diet recommendations: Regular;Thin liquid Liquids provided via: Cup;Straw Medication Administration: Whole meds with puree Supervision: Patient able to self feed Compensations: Slow rate;Small sips/bites Postural Changes and/or Swallow Maneuvers: Seated upright 90 degrees       Oral Care Recommendations: Oral care BID Follow up Recommendations: None Plan: All goals met    Germain Osgood, M.A. CCC-SLP [phone]  Germain Osgood 04/16/2014, 10:30 AM

## 2014-04-16 NOTE — Progress Notes (Signed)
CRITICAL VALUE ALERT  Critical value received:  2.7 Potassium  Date of notification:  04/16/14  Time of notification:  0650  Critical value read back:Yes.    Nurse who received alert:  Ronna PolioJennifer Brandi Tomlinson  MD notified (1st page):  Molli KnockYacoub  Time of first page:  0701  MD notified (2nd page):  Time of second page:  Responding MD:  Molli KnockYacoub  Time MD responded:  [phone]

## 2014-04-16 NOTE — Clinical Social Work Psychosocial (Signed)
     Clinical Social Work Department BRIEF PSYCHOSOCIAL ASSESSMENT 04/16/2014  Patient:  [name],[name]     Account Number:  [id number]     Admit date:  04/13/2014  Clinical Social Worker:  Harless NakayamaAMBELAL,Degan Hanser, LCSWA  Date/Time:  04/16/2014 10:20 AM  Referred by:  Physician  Date Referred:  04/16/2014 Referred for  SNF Placement   Other Referral:   Interview type:  Patient Other interview type:    PSYCHOSOCIAL DATA Living Status:  FACILITY [facility] Level of care:  Independent Living Primary support name:  [name] [phone] Primary support relationship to patient:  CHILD, ADULT Degree of support available:   Pt has good support from family and partner at facility    CURRENT CONCERNS Current Concerns  Post-Acute Placement   Other Concerns:    SOCIAL WORK ASSESSMENT / PLAN CSW visited pt room to discuss living situation. Pt [facility]. CSW notified pt that [facility] has held a skilled be incase pt needs this. Pt and CSW discussed option and pt confirms that she is agreeable to discharging to facility skilled portion to ensure safety. CSW notified pt that plan is for discharge today. Pt glad that she will be discharged.   Assessment/plan status:  Psychosocial Support/Ongoing Assessment of Needs Other assessment/ plan:   Information/referral to community resources:   none needed at this time    PATIENTS/FAMILYS RESPONSE TO PLAN OF CARE: Pt is agreeable to ST rehab at dc.      Taeko Schaffer, LCSWA  [phone]

## 2014-04-16 NOTE — Discharge Instructions (Signed)

## 2014-04-19 ENCOUNTER — Telehealth: Payer: Self-pay | Admitting: *Deleted

## 2014-04-19 ENCOUNTER — Telehealth: Payer: Self-pay | Admitting: Internal Medicine

## 2014-04-19 ENCOUNTER — Telehealth: Payer: Self-pay | Admitting: Physician Assistant

## 2014-04-19 NOTE — Telephone Encounter (Signed)
Closed encounter °

## 2014-04-19 NOTE — Telephone Encounter (Signed)
TCM pt.  [facility] [phone]). Tried multiple times to reach nurse but was unable to leave message or speak to nurse.  Spoke w/transportation to give them date of OV of 3/22 at 10:00 with Lilian ComaScott Weaver,PA.  Advised to bring her meds.  She verbalizes understanding.

## 2014-04-19 NOTE — Telephone Encounter (Signed)
New message      TCM appt on 04-27-14 per rhonda.

## 2014-04-19 NOTE — Telephone Encounter (Signed)
Holli HumblesPatricia H Trent  Tagen Brethauer J Kellin Fifer, RN           Pam   Please wotk this pt in to be seen by Dr Anne FuSkains next week. Has to be with him per his request.   Thanks  Bethann Berkshirerisha      Left message for pt to call back to schedule appt with Dr Caro HightSkain this week as instructed.  Pt does have an appt scheduled with Tereso NewcomerScott Weaver, PA 3/22.

## 2014-04-27 ENCOUNTER — Encounter: Payer: Self-pay | Admitting: *Deleted

## 2014-04-27 ENCOUNTER — Encounter: Payer: Self-pay | Admitting: Physician Assistant

## 2014-04-27 ENCOUNTER — Ambulatory Visit (INDEPENDENT_AMBULATORY_CARE_PROVIDER_SITE_OTHER): Payer: Medicare Other | Admitting: Physician Assistant

## 2014-04-27 VITALS — BP 118/65 | HR 67 | Ht 62.0 in | Wt 152.0 lb

## 2014-04-27 DIAGNOSIS — I482 Chronic atrial fibrillation, unspecified: Secondary | ICD-10-CM

## 2014-04-27 DIAGNOSIS — I10 Essential (primary) hypertension: Secondary | ICD-10-CM | POA: Diagnosis not present

## 2014-04-27 DIAGNOSIS — R001 Bradycardia, unspecified: Secondary | ICD-10-CM | POA: Diagnosis not present

## 2014-04-27 DIAGNOSIS — N189 Chronic kidney disease, unspecified: Secondary | ICD-10-CM | POA: Insufficient documentation

## 2014-04-27 DIAGNOSIS — I5042 Chronic combined systolic (congestive) and diastolic (congestive) heart failure: Secondary | ICD-10-CM

## 2014-04-27 DIAGNOSIS — M10271 Drug-induced gout, right ankle and foot: Secondary | ICD-10-CM

## 2014-04-27 NOTE — Patient Instructions (Signed)
Your physician recommends that you schedule a follow-up appointment in: 4-6 weeks with Dr. Anne FuSkains  Your physician recommends that you continue on your current medications as directed. Please refer to the Current Medication list given to you today.  WE WILL REQUEST RECENT LAB RESULTS FROM [facility] FOR SCOTT WEAVER, PA-C TO REVIEW  Thank you for choosing [facility]!!

## 2014-04-27 NOTE — Progress Notes (Signed)
Cardiology Office Note   Date:  04/27/2014   ID:  [name], DOB [birth year], MRN [id number]  PCP:  Ginette Otto, MD  Cardiologist:  Dr. Donato Schultz     Chief Complaint  Patient presents with  . Hospitalization Follow-up  . Atrial Fibrillation  . Bradycardia     History of Present Illness: [name] is a 81 y.o. female with a hx of asthma, chronic AFib (prior Coumadin >> recently changed to Eliquis), combined systolic and diastolic HF (EF 11-91% by echo 2013), HTN, CKD, possible TIA in 2013, probable dementia.    Admitted 3/8-3/11 with syncope and VDRF secondary to profound bradycardia (CHB) in the setting of hyperkalemia (K+ 7; initial HR 18 per EMS) and AKI on CKD.  AVN blocking agents were held.  She was supported with external pacer.  K+ was corrected.  Once the K+ was corrected, she was taken off of the external pacer and her HR remained in the 70s.  HR increased and she was slowly put back on calcium channel blocker and beta-blocker.  HR range 100-110 felt to be acceptable.  She was DC to SNF.    She is staying at [facility]. This is where she has lived. She was previously in the independent living facility.  She is now in skilled nursing.  She is here with her granddaughter who helps some with the hx.  She denies chest pain, palpitations.  She is working with PT.  She denies significant DOE. Denies syncope.  She sleeps on an incline.  Denies PND or edema.  Her Lasix was reduced recently to 20 mg Once daily.  BMET was drawn last week at [facility].    Studies/Reports Reviewed Today:  Echo 03/2011 - Moderate concentric hypertrophy. EF 45-50%. LV diastolic function cannot be assessed due to atrial fibrillation.  - Aortic valve: Mildly calcified, with possibly mild stenosis. Transvalvular velocity was minimally increased.  Mild regurgitation. Mean gradient: 10mm Hg (S). Peak gradient: 19mm Hg (S). - Mitral valve: Mildly thickened leaflets . Trivial regurgitation. -  Left atrium: Severely dilated. - Right atrium: The atrium was mildly dilated.    Past Medical History  Diagnosis Date  . Hypertension   . Chronic atrial fibrillation   . Arthritis   . Chronic combined systolic and diastolic CHF (congestive heart failure)     a. Dx 2013, EF 45-50%, felt due in part to rapid AF.  Marland Kitchen Asthma   . Hypokalemia   . TIA (transient ischemic attack)     a. Question TIA during 2013 admission with word finding difficulty, already on Coumadin at the time.  . CKD (chronic kidney disease), stage III     a. Per review of chart, likely CKD stage III in 2013.Marland Kitchen    Past Surgical History  Procedure Laterality Date  . Other surgical history      bilateral hip replacement  . Joint replacement      bilateral hip replacement     Current Outpatient Prescriptions  Medication Sig Dispense Refill  . acetaminophen (TYLENOL) 325 MG tablet Take 2 tablets (650 mg total) by mouth every 6 (six) hours as needed for headache. 30 tablet 0  . alum & mag hydroxide-simeth (MAALOX/MYLANTA) 200-200-20 MG/5ML suspension Take 30 mLs by mouth every 4 (four) hours as needed for indigestion or heartburn. 355 mL 0  . antiseptic oral rinse (CPC / CETYLPYRIDINIUM CHLORIDE 0.05%) 0.05 % LIQD solution 7 mLs by Mouth Rinse route 2 (two) times daily. 44 mL 0  .  apixaban (ELIQUIS) 2.5 MG TABS tablet Take 2.5 mg by mouth 2 (two) times daily.    Marland Kitchen diltiazem (CARDIZEM CD) 180 MG 24 hr capsule Take 1 capsule (180 mg total) by mouth daily. 30 capsule 0  . furosemide (LASIX) 20 MG tablet Take 20 mg by mouth daily.    Marland Kitchen HYDROcodone-acetaminophen (VICODIN) 5-500 MG per tablet Take 1 tablet by mouth every 6 (six) hours as needed for pain. 30 tablet 0  . metoprolol tartrate (LOPRESSOR) 25 MG tablet Take 0.5 tablets (12.5 mg total) by mouth 2 (two) times daily. 60 tablet 0  . Oyster Shell (OYSTER CALCIUM) 500 MG TABS tablet Take 500 mg of elemental calcium by mouth 2 (two) times daily.    . potassium chloride  SA (K-DUR,KLOR-CON) 20 MEQ tablet Take 20 mEq by mouth 2 (two) times daily.    . prednisoLONE acetate (PRED FORTE) 1 % ophthalmic suspension Place 1 drop into both eyes 4 (four) times daily.    . predniSONE (STERAPRED UNI-PAK) 10 MG tablet Take by mouth daily.    Marland Kitchen tiotropium (SPIRIVA) 18 MCG inhalation capsule Place 18 mcg into inhaler and inhale daily as needed (shortness of breath, wheezing).     No current facility-administered medications for this visit.    Allergies:   Shrimp    Social History:  The patient  reports that she has never smoked. She does not have any smokeless tobacco history on file. She reports that she does not drink alcohol or use illicit drugs.   Family History:  The patient's family history includes Heart attack in her brother and sister; Heart failure in her mother; Hypertension in her brother, mother, and sister; Stroke in her maternal aunt and mother.    ROS:   Please see the history of present illness.   Review of Systems  Cardiovascular: Positive for dyspnea on exertion and leg swelling.  Musculoskeletal: Positive for back pain and joint pain.       R great toe MTPJ pain  All other systems reviewed and are negative.     PHYSICAL EXAM: VS:  BP 118/65 mmHg  Pulse 67  Ht  (1.575 m)  Wt 152 lb (68.947 kg)  BMI 27.79 kg/m2    Wt Readings from Last 3 Encounters:  04/27/14 152 lb (68.947 kg)  04/16/14 155 lb 14.4 oz (70.716 kg)  03/22/11 143 lb 4.8 oz (65 kg)     GEN: Well nourished, well developed, in no acute distress HEENT: normal Neck: no JVD, no masses Cardiac:  Normal S1/S2, irreg irreg rhythm; no murmur ,  no rubs or gallops, no edema  Respiratory:  clear to auscultation bilaterally, no wheezing, rhonchi or rales. GI: soft, nontender, nondistended, + BS MS: no deformity or atrophy Skin: warm and dry  Neuro:  CNs II-XII intact, Strength and sensation are intact Psych: Normal affect   EKG:  EKG is ordered today.  It demonstrates:    Atrial fibrillation, HR 67   Recent Labs: 04/15/2014: Hemoglobin 12.7; Magnesium 2.0; Platelets 198 04/16/2014: BUN 15; Creatinine 1.06; Potassium 2.7*; Sodium 142    Recent Labs  04/13/14 1353 04/13/14 1405 04/13/14 1413 04/13/14 1633 04/13/14 2123 04/14/14 0345 04/14/14 1854 04/16/14 0505  K 7.0* 6.6* 6.3* 5.6* 4.4 4.1 4.1 2.7*     Recent Labs  04/13/14 1353 04/13/14 1413 04/13/14 1633 04/13/14 2123 04/14/14 0345 04/14/14 1854 04/16/14 0505  CREATININE 1.70* 1.60* 1.71*  1.71* 1.75* 1.53* 1.44* 1.06     Lipid Panel No results  found for: CHOL, TRIG, HDL, CHOLHDL, VLDL, LDLCALC, LDLDIRECT    ASSESSMENT AND PLAN:  Chronic atrial fibrillation  Rate is controlled on current regimen.  She is on Eliquis 2.5 mg Twice daily.  She is > 60 kg and most recent Creatinine was < 1.5. However, given her current status, I would recommend we keep her on the lower dose.  If we were to ever consider DCCV, she would need to be on 5 mg Twice daily.  But, at this point, the safest dose is probably 2.5 mg Twice daily and this will not be changed.  Continue current dose of calcium channel blocker and beta-blocker.  Symptomatic bradycardia Resolved.   Essential hypertension Controlled.  Chronic combined systolic and diastolic CHF (congestive heart failure) Volume stable.  CKD (chronic kidney disease), unspecified stage I will request the most recent BMET for our records.  Of note, Lasix was reduced from 40 mg >> 20 mg Once daily.  She is on the same dose of K+. Her granddaughter is concerned about this and wants to make sure she is on the right dose of K+.  I will make adjustments if necessary based upon her BMET results.   Acute drug-induced gout of right foot  Gout may be exacerbated by Lasix.  She is currently on Prednisone taper.  I advised her to notify her RN if not getting better.   Current medicines are reviewed at length with the patient today.  The patient has concerns  regarding medicines.  The following changes have been made:  no change  Labs/ tests ordered today include:   Orders Placed This Encounter  Procedures  . EKG 12-Lead    Disposition:   FU with Dr. Donato SchultzMark Skains 4-6 weeks.    Signed, Brynda RimScott Weaver, PA-C, MHS 04/27/2014 10:12 AM    [facility] [address], [place], [place]  [address] Phone: [phone]; Fax: [phone]

## 2014-04-27 NOTE — Telephone Encounter (Signed)
Pt kept appt with PA as scheduled.

## 2014-04-28 ENCOUNTER — Encounter: Payer: Self-pay | Admitting: Physician Assistant

## 2014-04-28 ENCOUNTER — Telehealth: Payer: Self-pay | Admitting: *Deleted

## 2014-04-28 NOTE — Telephone Encounter (Signed)
[facility]ree[facility] I advised HHRN of the lab results and of med change to decrease K+ to 20 meq daily, [facility] w/results faxed to [facility] Weaver, PA-C [phone].

## 2014-05-26 ENCOUNTER — Ambulatory Visit: Payer: Medicare Other | Admitting: Cardiology

## 2014-06-08 ENCOUNTER — Encounter: Payer: Self-pay | Admitting: Cardiology

## 2014-06-08 ENCOUNTER — Ambulatory Visit (INDEPENDENT_AMBULATORY_CARE_PROVIDER_SITE_OTHER): Payer: Medicare Other | Admitting: Cardiology

## 2014-06-08 VITALS — BP 106/78 | HR 78 | Ht 62.0 in | Wt 148.0 lb

## 2014-06-08 DIAGNOSIS — R001 Bradycardia, unspecified: Secondary | ICD-10-CM | POA: Diagnosis not present

## 2014-06-08 DIAGNOSIS — I482 Chronic atrial fibrillation, unspecified: Secondary | ICD-10-CM

## 2014-06-08 DIAGNOSIS — N183 Chronic kidney disease, stage 3 (moderate): Secondary | ICD-10-CM | POA: Diagnosis not present

## 2014-06-08 DIAGNOSIS — Z7901 Long term (current) use of anticoagulants: Secondary | ICD-10-CM | POA: Diagnosis not present

## 2014-06-08 NOTE — Patient Instructions (Signed)
Medication Instructions:  Your physician recommends that you continue on your current medications as directed. Please refer to the Current Medication list given to you today.  Labwork: None  Testing/Procedures: None  Follow-Up: Follow up in 4 months with Dr Anne FuSkains.  Thank you for choosing [facility]!!

## 2014-06-08 NOTE — Progress Notes (Signed)
Cardiology Office Note   Date:  06/08/2014   ID:  [name], DOB 14-Aug-1918, MRN [id number]  PCP:  Ginette Otto, MD  Cardiologist:  Dr. Donato Schultz     No chief complaint on file.    History of Present Illness: [name] is a 81 y.o. female with a hx of asthma, chronic atrial fibrillation (prior Coumadin >> recently changed to Eliquis), combined systolic and diastolic HF (EF 40-98% by echo 2013), HTN, CKD, possible TIA in 2013, probable dementia.    Admitted 3/8-3/11 with syncope and VDRF secondary to profound bradycardia (CHB) in the setting of hyperkalemia (K+ 7; initial HR 18 per EMS) and AKI on CKD.  AVN blocking agents were held.  She was supported with external pacer.  K+ was corrected.  Once the K+ was corrected, she was taken off of the external pacer and her HR remained in the 70s.  HR increased and she was slowly put back on calcium channel blocker and beta-blocker.  HR range 100-110 felt to be acceptable.  She was DC to SNF.    She is staying at [facility]. This is where she has lived. She is now back in the independent living facility.   She denies chest pain, palpitations.  She is working with PT.  She denies significant DOE. Denies syncope.  She sleeps on an incline.  Denies PND or edema.  Her Lasix was reduced recently to 20 mg Once daily.    Potassium posthospitalization at one point was 2.7. Her potassium supplementation was changed and on repeat check 04/28/14 her potassium was 4.8 and she was to decrease her potassium to 20 mEq daily.  Overall she has been doing well. Her granddaughter was very pleased  Studies/Reports Reviewed Today:  Echo 03/2011 - Moderate concentric hypertrophy. EF 45-50%. LV diastolic function cannot be assessed due to atrial fibrillation.  - Aortic valve: Mildly calcified, with possibly mild stenosis. Transvalvular velocity was minimally increased.  Mild regurgitation. Mean gradient: 10mm Hg (S). Peak gradient: 19mm Hg  (S). - Mitral valve: Mildly thickened leaflets . Trivial regurgitation. - Left atrium: Severely dilated. - Right atrium: The atrium was mildly dilated.    Past Medical History  Diagnosis Date  . Hypertension   . Chronic atrial fibrillation   . Arthritis   . Chronic combined systolic and diastolic CHF (congestive heart failure)     a. Dx 2013, EF 45-50%, felt due in part to rapid AF.  Marland Kitchen Asthma   . Hypokalemia   . TIA (transient ischemic attack)     a. Question TIA during 2013 admission with word finding difficulty, already on Coumadin at the time.  . CKD (chronic kidney disease), stage III     a. Per review of chart, likely CKD stage III in 2013.Marland Kitchen    Past Surgical History  Procedure Laterality Date  . Other surgical history      bilateral hip replacement  . Joint replacement      bilateral hip replacement     Current Outpatient Prescriptions  Medication Sig Dispense Refill  . acetaminophen (TYLENOL) 325 MG tablet Take 2 tablets (650 mg total) by mouth every 6 (six) hours as needed for headache. 30 tablet 0  . alum & mag hydroxide-simeth (MAALOX/MYLANTA) 200-200-20 MG/5ML suspension Take 30 mLs by mouth every 4 (four) hours as needed for indigestion or heartburn. 355 mL 0  . apixaban (ELIQUIS) 2.5 MG TABS tablet Take 2.5 mg by mouth 2 (two) times daily.    Marland Kitchen  diltiazem (CARDIZEM CD) 180 MG 24 hr capsule Take 1 capsule (180 mg total) by mouth daily. 30 capsule 0  . furosemide (LASIX) 20 MG tablet Take 20 mg by mouth daily.    Marland Kitchen. HYDROcodone-acetaminophen (VICODIN) 5-500 MG per tablet Take 1 tablet by mouth every 6 (six) hours as needed for pain. 30 tablet 0  . metoprolol tartrate (LOPRESSOR) 25 MG tablet Take 0.5 tablets (12.5 mg total) by mouth 2 (two) times daily. 60 tablet 0  . Oyster Shell (OYSTER CALCIUM) 500 MG TABS tablet Take 500 mg of elemental calcium by mouth 2 (two) times daily.    . potassium chloride SA (K-DUR,KLOR-CON) 20 MEQ tablet Take 20 mEq by mouth daily.    .  prednisoLONE acetate (PRED FORTE) 1 % ophthalmic suspension Place 1 drop into both eyes 4 (four) times daily.    Marland Kitchen. tiotropium (SPIRIVA) 18 MCG inhalation capsule Place 18 mcg into inhaler and inhale daily as needed (shortness of breath, wheezing).     No current facility-administered medications for this visit.    Allergies:   Shrimp    Social History:  The patient  reports that she has never smoked. She does not have any smokeless tobacco history on file. She reports that she does not drink alcohol or use illicit drugs.   Family History:  The patient's family history includes Heart attack in her brother and sister; Heart failure in her mother; Hypertension in her brother, mother, and sister; Stroke in her maternal aunt and mother.    ROS:   Please see the history of present illness.   Review of Systems  Cardiovascular: Negative for dyspnea on exertion, leg swelling and syncope.  Musculoskeletal: Positive for back pain and joint pain.  All other systems reviewed and are negative.     PHYSICAL EXAM: VS:  BP 106/78 mmHg  Pulse 78  Ht 5\' 2"  (1.575 m)  Wt 148 lb (67.132 kg)  BMI 27.06 kg/m2    Wt Readings from Last 3 Encounters:  06/08/14 148 lb (67.132 kg)  04/27/14 152 lb (68.947 kg)  04/16/14 155 lb 14.4 oz (70.716 kg)     GEN: Elderly in no acute distress HEENT: normal Neck: no JVD, no masses Cardiac:  Normal S1/S2, irreg irreg rhythm; no murmur ,  no rubs or gallops, no edema  Respiratory:  clear to auscultation bilaterally, no wheezing, rhonchi or rales. GI: soft, nontender, nondistended, + BS MS: no deformity or atrophy Skin: warm and dry  Neuro:  CNs II-XII intact, Strength and sensation are intact Psych: Normal affect   EKG:  EKG is ordered today.  It demonstrates:   Atrial fibrillation, HR 67   Recent Labs: 04/15/2014: Hemoglobin 12.7; Magnesium 2.0; Platelets 198 04/16/2014: BUN 15; Creatinine 1.06; Potassium 2.7*; Sodium 142    Recent Labs   04/13/14 1353 04/13/14 1405 04/13/14 1413 04/13/14 1633 04/13/14 2123 04/14/14 0345 04/14/14 1854 04/16/14 0505  K 7.0* 6.6* 6.3* 5.6* 4.4 4.1 4.1 2.7*     Recent Labs  04/13/14 1353 04/13/14 1413 04/13/14 1633 04/13/14 2123 04/14/14 0345 04/14/14 1854 04/16/14 0505  CREATININE 1.70* 1.60* 1.71*  1.71* 1.75* 1.53* 1.44* 1.06     Lipid Panel No results found for: CHOL, TRIG, HDL, CHOLHDL, VLDL, LDLCALC, LDLDIRECT    ASSESSMENT AND PLAN:  Chronic atrial fibrillation  Rate is controlled on current regimen.  She is on Eliquis 2.5 mg Twice daily.  She is > 60 kg and prior recent Creatinine was < 1.5, but with age  89 more comfortable with 2.5 BID. However, given her current status, I would recommend we keep her on the lower dose.    But, at this point, the safest dose is probably 2.5 mg Twice daily and this will not be changed.  Continue current dose of calcium channel blocker and beta-blocker.  Symptomatic bradycardia Resolved.   Essential hypertension Controlled.  Chronic combined systolic and diastolic CHF (congestive heart failure) Volume stable.  CKD (chronic kidney disease), unspecified stage Of note, Lasix was reduced from 40 mg >> 20 mg Once daily.  She is on the same dose of K+. Her granddaughter is concerned about this and wants to make sure she is on the right dose of K+.  I will make adjustments if necessary based upon her BMET results.   Acute drug-induced gout of right foot  Gout may have been exacerbated by Lasix.  Resolved.  Current medicines are reviewed at length with the patient today.  The patient has concerns regarding medicines.  The following changes have been made:  no change  Labs/ tests ordered today include:   No orders of the defined types were placed in this encounter.    Disposition:   FU with Dr. Donato Schultz 4-6 weeks.    Mathews Robinsons, MD 06/08/2014 9:41 AM    [facility] [address] [place],  [place], [place]  [address] Phone: [phone]; Fax: [phone]

## 2014-08-03 ENCOUNTER — Telehealth: Payer: Self-pay | Admitting: Cardiology

## 2014-08-03 NOTE — Telephone Encounter (Signed)
I spoke with the pt's grand-daughter Amber and she said the pt has taken 2 additional days of medication.  [facility] morning the pt's pill box was correct.  Today when Amber checked on the pt she had taken all of her medications in her pill box through this Thursday.  Amber is unsure when the pt took the additional 2 days of medication.  The pt lives in independent living and a nurse did come to her room and check her BP which was "normal" and pulse is 60-70. I advised Amber to hold off on any other medications today and restart medications tomorrow as prescribed.  I also recommended that the nurse come and check on the patient again later this evening and the pt be advised to change positions slowly.  Amber agreed with plan.  I will forward this message to Dr Anne FuSkains for review and any other recommendations if needed.

## 2014-08-03 NOTE — Telephone Encounter (Signed)
New Message       Pt's daughter calling stating that pt has accidentally taken 2 extra days of all of her medications and feels weak. [facility]. Please call daughter back and advise.

## 2014-08-04 NOTE — Telephone Encounter (Signed)
Agree. Sanela Evola, MD  

## 2014-08-04 NOTE — Telephone Encounter (Signed)
Left message for Amber that Dr Anne FuSkains agreed with what she and Lauren discussed yesterday.  No further changes are to be made but to call back if further questions or concerns.

## 2014-08-24 ENCOUNTER — Telehealth: Payer: Self-pay | Admitting: Cardiology

## 2014-08-24 NOTE — Telephone Encounter (Signed)
Received call from pt family member, [facility], that pt was dizzy this morning.  Spoke with pt she stated it had passed after she drank a coke this morning. Pt stated she had a can of soup last night and thinks it is not agreeing with her.  She was having naesea but had not vomited but was feeling better as of right now.  Instructed pt to call her PCP if symptoms continued.  Pt agreed and no additional questions at this time.  Contacted pt family  Sports administratorMember [name] told shared that we do not have a DPR on file but that I spoke with the pt and she can call her PCP.

## 2014-08-24 NOTE — Telephone Encounter (Signed)
New message     Pt is having problem with dizziness. Please call to discuss

## 2014-09-30 ENCOUNTER — Encounter (HOSPITAL_COMMUNITY): Payer: Self-pay | Admitting: *Deleted

## 2014-09-30 ENCOUNTER — Inpatient Hospital Stay (HOSPITAL_COMMUNITY): Payer: Medicare Other

## 2014-09-30 ENCOUNTER — Emergency Department (HOSPITAL_COMMUNITY): Payer: Medicare Other

## 2014-09-30 ENCOUNTER — Observation Stay (HOSPITAL_COMMUNITY)
Admission: EM | Admit: 2014-09-30 | Discharge: 2014-10-02 | Disposition: A | Discharge status: Skilled Nursing Facility | Payer: Medicare Other | Attending: Internal Medicine | Admitting: Internal Medicine

## 2014-09-30 DIAGNOSIS — Z7901 Long term (current) use of anticoagulants: Secondary | ICD-10-CM

## 2014-09-30 DIAGNOSIS — R29810 Facial weakness: Secondary | ICD-10-CM | POA: Insufficient documentation

## 2014-09-30 DIAGNOSIS — N183 Chronic kidney disease, stage 3 unspecified: Secondary | ICD-10-CM | POA: Diagnosis present

## 2014-09-30 DIAGNOSIS — R531 Weakness: Secondary | ICD-10-CM | POA: Diagnosis not present

## 2014-09-30 DIAGNOSIS — I739 Peripheral vascular disease, unspecified: Secondary | ICD-10-CM | POA: Diagnosis not present

## 2014-09-30 DIAGNOSIS — E876 Hypokalemia: Secondary | ICD-10-CM | POA: Insufficient documentation

## 2014-09-30 DIAGNOSIS — I69322 Dysarthria following cerebral infarction: Secondary | ICD-10-CM | POA: Diagnosis not present

## 2014-09-30 DIAGNOSIS — N390 Urinary tract infection, site not specified: Secondary | ICD-10-CM | POA: Diagnosis not present

## 2014-09-30 DIAGNOSIS — I482 Chronic atrial fibrillation, unspecified: Secondary | ICD-10-CM | POA: Diagnosis present

## 2014-09-30 DIAGNOSIS — R4781 Slurred speech: Secondary | ICD-10-CM | POA: Insufficient documentation

## 2014-09-30 DIAGNOSIS — I129 Hypertensive chronic kidney disease with stage 1 through stage 4 chronic kidney disease, or unspecified chronic kidney disease: Secondary | ICD-10-CM | POA: Insufficient documentation

## 2014-09-30 DIAGNOSIS — I771 Stricture of artery: Secondary | ICD-10-CM | POA: Diagnosis not present

## 2014-09-30 DIAGNOSIS — I517 Cardiomegaly: Secondary | ICD-10-CM | POA: Insufficient documentation

## 2014-09-30 DIAGNOSIS — R2990 Unspecified symptoms and signs involving the nervous system: Secondary | ICD-10-CM | POA: Diagnosis present

## 2014-09-30 DIAGNOSIS — D72829 Elevated white blood cell count, unspecified: Secondary | ICD-10-CM | POA: Diagnosis not present

## 2014-09-30 DIAGNOSIS — M1990 Unspecified osteoarthritis, unspecified site: Secondary | ICD-10-CM | POA: Insufficient documentation

## 2014-09-30 DIAGNOSIS — R55 Syncope and collapse: Secondary | ICD-10-CM | POA: Insufficient documentation

## 2014-09-30 DIAGNOSIS — I5042 Chronic combined systolic (congestive) and diastolic (congestive) heart failure: Secondary | ICD-10-CM | POA: Diagnosis not present

## 2014-09-30 DIAGNOSIS — I272 Other secondary pulmonary hypertension: Secondary | ICD-10-CM | POA: Diagnosis not present

## 2014-09-30 DIAGNOSIS — E869 Volume depletion, unspecified: Secondary | ICD-10-CM | POA: Insufficient documentation

## 2014-09-30 DIAGNOSIS — J45909 Unspecified asthma, uncomplicated: Secondary | ICD-10-CM | POA: Insufficient documentation

## 2014-09-30 DIAGNOSIS — R4701 Aphasia: Secondary | ICD-10-CM | POA: Diagnosis not present

## 2014-09-30 DIAGNOSIS — N179 Acute kidney failure, unspecified: Secondary | ICD-10-CM | POA: Insufficient documentation

## 2014-09-30 DIAGNOSIS — G9389 Other specified disorders of brain: Secondary | ICD-10-CM | POA: Diagnosis not present

## 2014-09-30 DIAGNOSIS — I639 Cerebral infarction, unspecified: Secondary | ICD-10-CM | POA: Diagnosis not present

## 2014-09-30 DIAGNOSIS — R9431 Abnormal electrocardiogram [ECG] [EKG]: Secondary | ICD-10-CM | POA: Diagnosis present

## 2014-09-30 HISTORY — DX: Cerebral infarction, unspecified: I63.9

## 2014-09-30 LAB — COMPREHENSIVE METABOLIC PANEL
ALK PHOS: 66 U/L (ref 38–126)
ALT: 16 U/L (ref 14–54)
AST: 29 U/L (ref 15–41)
Albumin: 3 g/dL — ABNORMAL LOW (ref 3.5–5.0)
Anion gap: 10 (ref 5–15)
BILIRUBIN TOTAL: 1.6 mg/dL — AB (ref 0.3–1.2)
BUN: 26 mg/dL — ABNORMAL HIGH (ref 6–20)
CALCIUM: 9.8 mg/dL (ref 8.9–10.3)
CO2: 25 mmol/L (ref 22–32)
Chloride: 103 mmol/L (ref 101–111)
Creatinine, Ser: 1.58 mg/dL — ABNORMAL HIGH (ref 0.44–1.00)
GFR calc non Af Amer: 27 mL/min — ABNORMAL LOW (ref >60)
GFR, EST AFRICAN AMERICAN: 31 mL/min — AB (ref >60)
Glucose, Bld: 120 mg/dL — ABNORMAL HIGH (ref 65–99)
Potassium: 4.4 mmol/L (ref 3.5–5.1)
Sodium: 138 mmol/L (ref 135–145)
TOTAL PROTEIN: 6.3 g/dL — AB (ref 6.5–8.1)

## 2014-09-30 LAB — RAPID URINE DRUG SCREEN, HOSP PERFORMED
Amphetamines: NOT DETECTED
Barbiturates: NOT DETECTED
Benzodiazepines: NOT DETECTED
Cocaine: NOT DETECTED
OPIATES: NOT DETECTED
Tetrahydrocannabinol: NOT DETECTED

## 2014-09-30 LAB — I-STAT TROPONIN, ED: Troponin i, poc: 0.03 ng/mL (ref 0.00–0.08)

## 2014-09-30 LAB — URINALYSIS, ROUTINE W REFLEX MICROSCOPIC
Glucose, UA: NEGATIVE mg/dL
Hgb urine dipstick: NEGATIVE
Ketones, ur: 15 mg/dL — AB
Nitrite: NEGATIVE
SPECIFIC GRAVITY, URINE: 1.022 (ref 1.005–1.030)
UROBILINOGEN UA: 0.2 mg/dL (ref 0.0–1.0)
pH: 5 (ref 5.0–8.0)

## 2014-09-30 LAB — CBC
HCT: 41.7 % (ref 36.0–46.0)
HEMOGLOBIN: 13.7 g/dL (ref 12.0–15.0)
MCH: 28.7 pg (ref 26.0–34.0)
MCHC: 32.9 g/dL (ref 30.0–36.0)
MCV: 87.2 fL (ref 78.0–100.0)
PLATELETS: 241 10*3/uL (ref 150–400)
RBC: 4.78 MIL/uL (ref 3.87–5.11)
RDW: 13.5 % (ref 11.5–15.5)
WBC: 15.6 10*3/uL — ABNORMAL HIGH (ref 4.0–10.5)

## 2014-09-30 LAB — URINE MICROSCOPIC-ADD ON

## 2014-09-30 LAB — I-STAT CHEM 8, ED
BUN: 32 mg/dL — ABNORMAL HIGH (ref 6–20)
CALCIUM ION: 1.25 mmol/L (ref 1.13–1.30)
Chloride: 102 mmol/L (ref 101–111)
Creatinine, Ser: 1.4 mg/dL — ABNORMAL HIGH (ref 0.44–1.00)
Glucose, Bld: 120 mg/dL — ABNORMAL HIGH (ref 65–99)
HCT: 44 % (ref 36.0–46.0)
Hemoglobin: 15 g/dL (ref 12.0–15.0)
Potassium: 4.1 mmol/L (ref 3.5–5.1)
SODIUM: 139 mmol/L (ref 135–145)
TCO2: 25 mmol/L (ref 0–100)

## 2014-09-30 LAB — DIFFERENTIAL
Basophils Absolute: 0 10*3/uL (ref 0.0–0.1)
Basophils Relative: 0 % (ref 0–1)
EOS PCT: 0 % (ref 0–5)
Eosinophils Absolute: 0 10*3/uL (ref 0.0–0.7)
LYMPHS ABS: 2 10*3/uL (ref 0.7–4.0)
LYMPHS PCT: 13 % (ref 12–46)
Monocytes Absolute: 1.3 10*3/uL — ABNORMAL HIGH (ref 0.1–1.0)
Monocytes Relative: 8 % (ref 3–12)
Neutro Abs: 12.3 10*3/uL — ABNORMAL HIGH (ref 1.7–7.7)
Neutrophils Relative %: 79 % — ABNORMAL HIGH (ref 43–77)

## 2014-09-30 LAB — CK: CK TOTAL: 114 U/L (ref 38–234)

## 2014-09-30 LAB — ETHANOL: Alcohol, Ethyl (B): <5 mg/dL (ref <5)

## 2014-09-30 LAB — PROTIME-INR
INR: 1.78 — ABNORMAL HIGH (ref 0.00–1.49)
PROTHROMBIN TIME: 20.7 s — AB (ref 11.6–15.2)

## 2014-09-30 LAB — APTT: aPTT: 37 seconds (ref 24–37)

## 2014-09-30 MED ORDER — ALUM & MAG HYDROXIDE-SIMETH 200-200-20 MG/5ML PO SUSP: 30.0000 mL | ORAL | Status: DC | PRN

## 2014-09-30 MED ORDER — VALPROATE SODIUM 500 MG/5ML IV SOLN
500.0000 mg | Freq: Three times a day (TID) | INTRAVENOUS | Status: DC
  Filled 2014-09-30 (×4): qty 5

## 2014-09-30 MED ORDER — SODIUM CHLORIDE 0.9 % IV SOLN
INTRAVENOUS | Status: DC
  Administered 2014-09-30: 21:00:00 via INTRAVENOUS

## 2014-09-30 MED ORDER — STROKE: EARLY STAGES OF RECOVERY BOOK
Freq: Once | Status: AC
  Administered 2014-10-01: 1
  Filled 2014-09-30 (×2): qty 1

## 2014-09-30 MED ORDER — ENOXAPARIN SODIUM 30 MG/0.3ML ~~LOC~~ SOLN
30.0000 mg | SUBCUTANEOUS | Status: DC
  Administered 2014-09-30: 30 mg via SUBCUTANEOUS
  Filled 2014-09-30: qty 0.3

## 2014-09-30 MED ORDER — METOPROLOL TARTRATE 12.5 MG HALF TABLET
12.5000 mg | ORAL_TABLET | Freq: Two times a day (BID) | ORAL | Status: DC
  Administered 2014-09-30 – 2014-10-02 (×4): 12.5 mg via ORAL
  Filled 2014-09-30 (×4): qty 1

## 2014-09-30 MED ORDER — DILTIAZEM HCL ER COATED BEADS 180 MG PO CP24
180.0000 mg | ORAL_CAPSULE | Freq: Every day | ORAL | Status: DC
  Administered 2014-10-01 – 2014-10-02 (×2): 180 mg via ORAL
  Filled 2014-09-30 (×2): qty 1

## 2014-09-30 MED ORDER — PREDNISOLONE ACETATE 1 % OP SUSP
1.0000 [drp] | Freq: Four times a day (QID) | OPHTHALMIC | Status: DC
  Administered 2014-09-30 – 2014-10-02 (×5): 1 [drp] via OPHTHALMIC
  Filled 2014-09-30: qty 1

## 2014-09-30 MED ORDER — TIOTROPIUM BROMIDE MONOHYDRATE 18 MCG IN CAPS
18.0000 ug | ORAL_CAPSULE | Freq: Every day | RESPIRATORY_TRACT | Status: DC | PRN
  Filled 2014-09-30: qty 5

## 2014-09-30 NOTE — ED Provider Notes (Signed)
CSN: [id number]     Arrival date & time 09/30/14  1252 History   First MD Initiated Contact with Patient 09/30/14 1255     Chief Complaint  Patient presents with  . Altered Mental Status     (Consider location/radiation/quality/duration/timing/severity/associated sxs/prior Treatment) HPI Comments: 81 year old female with a past medical history of hypertension, chronic atrial fibrillation, chronic combined systolic and diastolic CHF, asthma, hypokalemia, TIA and stage III CKD presenting via EMS from [facility] with concerns of right-sided facial droop. Patient was found in the flexor after ambulating a few blocks with a walker, and when she was found she was unable to be, confused and had right-sided weakness and right-sided facial droop. Patient states she had been at Bible study at the church. She is not sure when she went to church. Having difficulty expressing her thoughts. Level 5 caveat secondary to altered mental status. Per granddaughter, the patient was "acting funny" 2 days ago and was complaining of dizziness.  Patient is a 81 y.o. female presenting with altered mental status. The history is provided by the patient and the EMS personnel.  Altered Mental Status   Past Medical History  Diagnosis Date  . Hypertension   . Chronic atrial fibrillation   . Arthritis   . Chronic combined systolic and diastolic CHF (congestive heart failure)     a. Dx 2013, EF 45-50%, felt due in part to rapid AF.  Marland Kitchen Asthma   . Hypokalemia   . TIA (transient ischemic attack)     a. Question TIA during 2013 admission with word finding difficulty, already on Coumadin at the time.  . CKD (chronic kidney disease), stage III     a. Per review of chart, likely CKD stage III in 2013.Marland Kitchen   Past Surgical History  Procedure Laterality Date  . Other surgical history      bilateral hip replacement  . Joint replacement      bilateral hip replacement   Family History  Problem Relation  Age of Onset  . Heart failure Mother   . Heart attack Sister   . Heart attack Brother   . Stroke Mother   . Stroke Maternal Aunt   . Hypertension Mother   . Hypertension Sister   . Hypertension Brother    Social History  Substance Use Topics  . Smoking status: Never Smoker   . Smokeless tobacco: None  . Alcohol Use: No   OB History    No data available     Review of Systems  Unable to perform ROS: Mental status change      Allergies  Shrimp  Home Medications   Prior to Admission medications   Medication Sig Start Date End Date Taking? Authorizing Provider  acetaminophen (TYLENOL) 325 MG tablet Take 2 tablets (650 mg total) by mouth every 6 (six) hours as needed for headache. 04/16/14  Yes Alison Murray, MD  alum & mag hydroxide-simeth (MAALOX/MYLANTA) 200-200-20 MG/5ML suspension Take 30 mLs by mouth every 4 (four) hours as needed for indigestion or heartburn. 04/16/14  Yes Alison Murray, MD  apixaban (ELIQUIS) 2.5 MG TABS tablet Take 2.5 mg by mouth 2 (two) times daily.   Yes Historical Provider, MD  diltiazem (CARDIZEM CD) 180 MG 24 hr capsule Take 1 capsule (180 mg total) by mouth daily. 04/16/14  Yes Alison Murray, MD  furosemide (LASIX) 20 MG tablet Take 20 mg by mouth daily.   Yes Historical Provider, MD  metoprolol tartrate (LOPRESSOR) 25 MG  tablet Take 0.5 tablets (12.5 mg total) by mouth 2 (two) times daily. 04/16/14  Yes Alison Murray, MD  Oyster Shell (OYSTER CALCIUM) 500 MG TABS tablet Take 500 mg of elemental calcium by mouth 2 (two) times daily.   Yes Historical Provider, MD  potassium chloride SA (K-DUR,KLOR-CON) 20 MEQ tablet Take 20 mEq by mouth daily.   Yes Historical Provider, MD  prednisoLONE acetate (PRED FORTE) 1 % ophthalmic suspension Place 1 drop into both eyes 4 (four) times daily.   Yes Historical Provider, MD  tiotropium (SPIRIVA) 18 MCG inhalation capsule Place 18 mcg into inhaler and inhale daily as needed (shortness of breath, wheezing).   Yes  Historical Provider, MD  HYDROcodone-acetaminophen (VICODIN) 5-500 MG per tablet Take 1 tablet by mouth every 6 (six) hours as needed for pain. 04/16/14   Alison Murray, MD   BP 113/55 mmHg  Pulse 73  Temp(Src) 97.8 F (36.6 C) (Oral)  Resp 25  SpO2 96% Physical Exam  Constitutional: She appears well-developed and well-nourished. No distress.  HENT:  Head: Normocephalic and atraumatic.  Mouth/Throat: Oropharynx is clear and moist.  Eyes: Conjunctivae and EOM are normal.  Neck: Normal range of motion. Neck supple.  Cardiovascular: Normal rate, regular rhythm and normal heart sounds.   Pulmonary/Chest: Effort normal. No respiratory distress. She has rhonchi.  Musculoskeletal: Normal range of motion. She exhibits no edema.  Neurological: She is alert. A cranial nerve deficit is present. No sensory deficit.  Oriented to person and place but not time. Strength upper and lower extremities 5/5 and equal bilateral. Right-sided facial droop. Left pronator drift. Sensation intact to light touch bilateral. Expressive aphasia.  Skin: Skin is warm and dry.  Psychiatric: She has a normal mood and affect. Her behavior is normal.  Nursing note and vitals reviewed.   ED Course  Procedures (including critical care time) Labs Review Labs Reviewed  PROTIME-INR - Abnormal; Notable for the following:    Prothrombin Time 20.7 (*)    INR 1.78 (*)    All other components within normal limits  CBC - Abnormal; Notable for the following:    WBC 15.6 (*)    All other components within normal limits  DIFFERENTIAL - Abnormal; Notable for the following:    Neutrophils Relative % 79 (*)    Neutro Abs 12.3 (*)    Monocytes Absolute 1.3 (*)    All other components within normal limits  COMPREHENSIVE METABOLIC PANEL - Abnormal; Notable for the following:    Glucose, Bld 120 (*)    BUN 26 (*)    Creatinine, Ser 1.58 (*)    Total Protein 6.3 (*)    Albumin 3.0 (*)    Total Bilirubin 1.6 (*)    GFR calc  non Af Amer 27 (*)    GFR calc Af Amer 31 (*)    All other components within normal limits  I-STAT CHEM 8, ED - Abnormal; Notable for the following:    BUN 32 (*)    Creatinine, Ser 1.40 (*)    Glucose, Bld 120 (*)    All other components within normal limits  ETHANOL  APTT  URINE RAPID DRUG SCREEN, HOSP PERFORMED  URINALYSIS, ROUTINE W REFLEX MICROSCOPIC (NOT AT [facility])  Rosezena Sensor, ED    Imaging Review Dg Chest 2 View  09/30/2014   CLINICAL DATA:  81 year old female with syncope today.  EXAM: CHEST  2 VIEW  COMPARISON:  04/14/2014 and prior radiographs  FINDINGS: Cardiomegaly is again identified.  Mild interstitial prominence is unchanged.  There is no evidence of focal airspace disease, pulmonary edema, suspicious pulmonary nodule/mass, pleural effusion, or pneumothorax.  No acute bony abnormalities are identified.  IMPRESSION: Cardiomegaly without evidence of acute cardiopulmonary disease.   Electronically Signed   By: Harmon Pier M.D.   On: 09/30/2014 14:36   Ct Head Wo Contrast  09/30/2014   CLINICAL DATA:  Patient found down after walking multiple blocks. Confused. No reported loss of consciousness.  EXAM: CT HEAD WITHOUT CONTRAST  TECHNIQUE: Contiguous axial images were obtained from the base of the skull through the vertex without intravenous contrast.  COMPARISON:  Brain CT 08/30/2011; MR brain 08/30/2011  FINDINGS: Left occipital lobe encephalomalacia with ex vacuo dilatation of the left lateral ventricle. Cortical atrophy. Chronic small vessel ischemic changes. Right basal ganglia lacunar infarct. No evidence for acute cortically based infarct, intracranial hemorrhage, mass lesion or mass-effect. The orbits are unremarkable. The paranasal sinuses are unremarkable. The mastoid air cells are well aerated. Calvarium is intact.  IMPRESSION: Age-indeterminate but likely chronic left occipital lobe infarct.  Chronic small vessel ischemic changes and cortical atrophy.  No acute  intracranial process.   Electronically Signed   By: Annia Belt M.D.   On: 09/30/2014 13:59   I have personally reviewed and evaluated these images and lab results as part of my medical decision-making.   EKG Interpretation   Date/Time:  Thursday September 30 2014 13:00:58 EDT Ventricular Rate:  72 PR Interval:    QRS Duration: 89 QT Interval:  548 QTC Calculation: 600 R Axis:   82 Text Interpretation:  Atrial fibrillation Borderline right axis deviation  Borderline repol abnrm, anterolateral leads Prolonged QT interval  Confirmed by Adriana Simas  MD, BRIAN ([id number]) on 09/30/2014 1:57:17 PM      MDM   Final diagnoses:  Facial droop  Stroke-like symptoms   Nontoxic appearing, NAD. Afebrile. Vital signs stable. Right-sided facial droop, left-sided pronator drift and expressive aphasia on exam. Last seen normal 2 days ago. Probable stroke. Head CT without acute finding. Doubt altered mental status due to infectious process. Will admit. Admission accepted by Junious Silk, NP with [facility].  Discussed with attending Dr. Adriana Simas who also evaluated patient and agrees with plan of care.  Kathrynn Speed, PA-C 09/30/14 1603  Donnetta Hutching, MD 10/02/14 [phone]

## 2014-09-30 NOTE — H&P (Signed)
[facility] History and Physical                                                                                    [name], is a 81 y.o. female  MRN: [id number]   DOB - 08-19-1918  Admit Date - 09/30/2014  Outpatient Primary MD for the patient is Ginette Otto, MD  Referring MD: Adriana Simas / ER  Consulting M.D: Amada Jupiter / Neurology  With History of -  Past Medical History  Diagnosis Date  . Hypertension   . Chronic atrial fibrillation   . Arthritis   . Chronic combined systolic and diastolic CHF (congestive heart failure)     a. Dx 2013, EF 45-50%, felt due in part to rapid AF.  Marland Kitchen Asthma   . Hypokalemia   . TIA (transient ischemic attack)     a. Question TIA during 2013 admission with word finding difficulty, already on Coumadin at the time.  . CKD (chronic kidney disease), stage III     a. Per review of chart, likely CKD stage III in 2013.Marland Kitchen      Past Surgical History  Procedure Laterality Date  . Other surgical history      bilateral hip replacement  . Joint replacement      bilateral hip replacement    in for   Chief Complaint  Patient presents with  . Altered Mental Status     HPI This is a 81 year old female patient with history of TIA in 2013 while on Coumadin, hypertension, chronic atrial fibrillation on eliquis, arthritis, chronic combined systolic and diastolic congestive heart failure with the systolic component felt to be related to rapid atrial fibrillation, asthma, stage III chronic kidney disease. Patient presents to the ER today after being found at her assisted living facility with right facial droop, dysarthria and right-sided weakness. According to the daughter and home health nurse at the assisted living facility was alerted that the patient had not been seen since lunch the day before and did not report to breakfast today. Someone was sent to the patient's room to check on her and found her on the floor; he had been there for  unknown period of time. At that time it was noticed that the patient had slurred speech with right-sided weakness and right-sided facial droop. She was last seen normal again at lunch the day before and there was a report she may have been experiencing dizziness 2 days prior.  In the ER patient was afebrile, pulse was regular, blood pressure was 104/71, room air saturations were 96%. A CT of the head was completed that demonstrated age indeterminate but likely chronic left occipital lobe infarct with chronic small vessel ischemic changes and cortical atrophy but no acute intracranial process. Left lower panel unremarkable except for mild acute renal insufficiency with a BUN of 32 and a creatinine of 1.4 with previous baseline earlier in the year of BUN 15 creatinine 1.06. Troponin was normal, total bilirubin was slightly elevated at 1.6 with otherwise normal LFTs, glucose slightly elevated at 120. Asian had leukocytosis with a white count 15,600 and neutrophils 79%. PT 20.7 and INR 1.78 with PTT 37. Serum alcohol  was less than 5, chest x-ray revealed cardiomegaly without evidence of acute cardiopulmonary disease.  At the time I interviewed and examined the patient right-sided weakness had resolved, she continued with right facial drooping as well as slurred speech and dysarthria and mild expressive aphasia. She is not had any constitutional symptoms such as fevers chills nausea vomiting abdominal pain diarrhea.  Review of Systems   In addition to the HPI above,  No Fever-chills, myalgias or other constitutional symptoms No Headache, changes with Vision or hearing No problems swallowing food or Liquids, indigestion/reflux No Chest pain, Cough or Shortness of Breath, palpitations, orthopnea or DOE No Abdominal pain, N/V; no melena or hematochezia, no dark tarry stools, Bowel movements are regular, No dysuria, hematuria or flank pain No new skin rashes, lesions, masses or bruises, No new joints  pains-aches No recent weight gain or loss No polyuria, polydypsia or polyphagia,  *A full 10 point Review of Systems was done, except as stated above, all other Review of Systems were negative.  Social History Social History  Substance Use Topics  . Smoking status: Never Smoker   . Smokeless tobacco: Not on file  . Alcohol Use: No    Resides at: Assisted living  Lives with: Alone  Ambulatory status: Without assistive devices   Family History Family History  Problem Relation Age of Onset  . Heart failure Mother   . Heart attack Sister   . Heart attack Brother   . Stroke Mother   . Stroke Maternal Aunt   . Hypertension Mother   . Hypertension Sister   . Hypertension Brother      Prior to Admission medications   Medication Sig Start Date End Date Taking? Authorizing Provider  acetaminophen (TYLENOL) 325 MG tablet Take 2 tablets (650 mg total) by mouth every 6 (six) hours as needed for headache. 04/16/14  Yes Alison Murray, MD  alum & mag hydroxide-simeth (MAALOX/MYLANTA) 200-200-20 MG/5ML suspension Take 30 mLs by mouth every 4 (four) hours as needed for indigestion or heartburn. 04/16/14  Yes Alison Murray, MD  apixaban (ELIQUIS) 2.5 MG TABS tablet Take 2.5 mg by mouth 2 (two) times daily.   Yes Historical Provider, MD  diltiazem (CARDIZEM CD) 180 MG 24 hr capsule Take 1 capsule (180 mg total) by mouth daily. 04/16/14  Yes Alison Murray, MD  furosemide (LASIX) 20 MG tablet Take 20 mg by mouth daily.   Yes Historical Provider, MD  metoprolol tartrate (LOPRESSOR) 25 MG tablet Take 0.5 tablets (12.5 mg total) by mouth 2 (two) times daily. 04/16/14  Yes Alison Murray, MD  Oyster Shell (OYSTER CALCIUM) 500 MG TABS tablet Take 500 mg of elemental calcium by mouth 2 (two) times daily.   Yes Historical Provider, MD  potassium chloride SA (K-DUR,KLOR-CON) 20 MEQ tablet Take 20 mEq by mouth daily.   Yes Historical Provider, MD  prednisoLONE acetate (PRED FORTE) 1 % ophthalmic suspension  Place 1 drop into both eyes 4 (four) times daily.   Yes Historical Provider, MD  tiotropium (SPIRIVA) 18 MCG inhalation capsule Place 18 mcg into inhaler and inhale daily as needed (shortness of breath, wheezing).   Yes Historical Provider, MD  HYDROcodone-acetaminophen (VICODIN) 5-500 MG per tablet Take 1 tablet by mouth every 6 (six) hours as needed for pain. 04/16/14   Alison Murray, MD    Allergies  Allergen Reactions  . Shrimp [Shellfish Allergy] Swelling    Physical Exam  Vitals  Blood pressure 122/84, pulse 69, temperature 97.8  F (36.6 C), temperature source Oral, resp. rate 23, SpO2 98 %.   General:  In no acute distress, appears stated age  Psych:  Normal affect, Denies Suicidal or Homicidal ideations, Awake Alert, Oriented X name, place, not to year, somewhat slow to respond and admits to word finding difficulties, speech is slightly slurred.   Neuro: CN II through XII intact except for noted right facial drooping, Strength 5/5 all 4 extremities, Sensation intact all 4 extremities.  ENT:  Ears and Eyes appear Normal, Conjunctivae clear, PER. Moist oral mucosa without erythema or exudates.  Neck:  Supple, No lymphadenopathy appreciated  Respiratory:  Symmetrical chest wall movement, Good air movement bilaterally, CTAB. Room Air; does have coarse upper airway sounds and improved with cough  Cardiac: Irregular with underlying atrial fib, No Murmurs, no LE edema noted, no JVD, No carotid bruits, peripheral pulses palpable at 2+  Abdomen:  Positive bowel sounds, Soft, Non tender, Non distended,  No masses appreciated, no obvious hepatosplenomegaly  Skin:  No Cyanosis, Normal Skin Turgor, No Skin Rash or Bruise.  Extremities: Symmetrical without obvious trauma or injury,  no effusions.  Data Review  CBC  Recent Labs Lab 09/30/14 1337 09/30/14 1347  WBC 15.6*  --   HGB 13.7 15.0  HCT 41.7 44.0  PLT 241  --   MCV 87.2  --   MCH 28.7  --   MCHC 32.9  --   RDW  13.5  --   LYMPHSABS 2.0  --   MONOABS 1.3*  --   EOSABS 0.0  --   BASOSABS 0.0  --     Chemistries   Recent Labs Lab 09/30/14 1337 09/30/14 1347  NA 138 139  K 4.4 4.1  CL 103 102  CO2 25  --   GLUCOSE 120* 120*  BUN 26* 32*  CREATININE 1.58* 1.40*  CALCIUM 9.8  --   AST 29  --   ALT 16  --   ALKPHOS 66  --   BILITOT 1.6*  --    Coagulation profile  Recent Labs Lab 09/30/14 1337  INR 1.78*    No results for input(s): DDIMER in the last 72 hours.  Cardiac Enzymes No results for input(s): CKMB, TROPONINI, MYOGLOBIN in the last 168 hours.  Invalid input(s): CK  Invalid input(s): POCBNP  Urinalysis    Component Value Date/Time   COLORURINE YELLOW 04/13/2014 1540   APPEARANCEUR CLEAR 04/13/2014 1540   LABSPEC 1.010 04/13/2014 1540   PHURINE 7.5 04/13/2014 1540   GLUCOSEU NEGATIVE 04/13/2014 1540   HGBUR NEGATIVE 04/13/2014 1540   BILIRUBINUR NEGATIVE 04/13/2014 1540   KETONESUR NEGATIVE 04/13/2014 1540   PROTEINUR NEGATIVE 04/13/2014 1540   UROBILINOGEN 0.2 04/13/2014 1540   NITRITE NEGATIVE 04/13/2014 1540   LEUKOCYTESUR NEGATIVE 04/13/2014 1540    Imaging results:   Dg Chest 2 View  09/30/2014   CLINICAL DATA:  81 year old female with syncope today.  EXAM: CHEST  2 VIEW  COMPARISON:  04/14/2014 and prior radiographs  FINDINGS: Cardiomegaly is again identified.  Mild interstitial prominence is unchanged.  There is no evidence of focal airspace disease, pulmonary edema, suspicious pulmonary nodule/mass, pleural effusion, or pneumothorax.  No acute bony abnormalities are identified.  IMPRESSION: Cardiomegaly without evidence of acute cardiopulmonary disease.   Electronically Signed   By: Harmon Pier M.D.   On: 09/30/2014 14:36   Ct Head Wo Contrast  09/30/2014   CLINICAL DATA:  Patient found down after walking multiple blocks. Confused. No reported loss  of consciousness.  EXAM: CT HEAD WITHOUT CONTRAST  TECHNIQUE: Contiguous axial images were obtained from  the base of the skull through the vertex without intravenous contrast.  COMPARISON:  Brain CT 08/30/2011; MR brain 08/30/2011  FINDINGS: Left occipital lobe encephalomalacia with ex vacuo dilatation of the left lateral ventricle. Cortical atrophy. Chronic small vessel ischemic changes. Right basal ganglia lacunar infarct. No evidence for acute cortically based infarct, intracranial hemorrhage, mass lesion or mass-effect. The orbits are unremarkable. The paranasal sinuses are unremarkable. The mastoid air cells are well aerated. Calvarium is intact.  IMPRESSION: Age-indeterminate but likely chronic left occipital lobe infarct.  Chronic small vessel ischemic changes and cortical atrophy.  No acute intracranial process.   Electronically Signed   By: Annia Belt M.D.   On: 09/30/2014 13:59     EKG: (Independently reviewed) atrial fibrillation with ventricular rate 72, no ST segment or T-wave changes concerning for ischemia, has marked QTC prolongation 600 ms which is new when compared to EKG in March as well as EKG in 2013   Assessment & Plan CVA (cerebral infarction) -Admit to telemetry -Neurology consult -Ischemic stroke order set initiated -Stat MRI/MRA brain -Discussed with Dr. Reather Laurence CT head negative we'll need to hold Eliquis until MRI has resulted; if has had extensive stroke will need to discontinue Eliquis in favor of full dose aspirin to minimize risk of hemorrhagic conversion -Echocardiogram and carotid duplex -Lipid panel and hemoglobin A1c -PT/OT/SLP evaluations   Acute renal failure superimposed on stage 3 chronic kidney disease -Gentle IV fluid hydration -Holding Lasix for now -Was down for unknown period of time so will check CK level     Chronic atrial fibrillation/QT prolongation  -Continue Cardizem and Lopressor since these are not implicated and QT prolongation -Repeat EKG now and again in a.m.; if QT prolongation persists will need to have formal cardiology  evaluation    Chronic combined systolic and diastolic CHF (congestive heart failure) -Compensated; in fact volume depleted  -will hold diuretics -daily weights, strict intake and output -Follow up on echocardiogram ordered for CVA diagnosis -will provide gentle hydration overnight    Leukocytosis -No fever and no frank signs of infection -will Check urinalysis and culture -Possibly related to being down on ground for prolonged period of time -Chest x-ray and lungs are clear on exam but could've experienced occult aspiration so continue to monitor respiratory status -if she spike fever, will repeat CXR after IVF's and then initiate abx's    Asthma -No wheezing on exam; condition is stable -Will continue home medication regimen    Chronic anticoagulation -Holding Eliquis for now as recommended by neurology -Will follow results of patient's MRI and if no signs of large stroke will resume current anticoagulation     DVT Prophylaxis: Lovenox while eliquis on hold  Family Communication:  Daughter at bedside   Code Status:  DO NOT RESUSCITATE  Condition:  Stable  Discharge disposition: anticipate discharge back to assisted living pending resolution of strokelike symptoms and pending PT/OT evaluation  Time spent in minutes : 60    Vassie Loll    09/30/2014 at 4:29 PM  Between 7am to 7pm - Pager - [phone]  After 7pm go to [email or web address] - password [email or web address]  And look for the night coverage person covering me after hours

## 2014-09-30 NOTE — ED Notes (Signed)
Patient unavailable for neuro check. Taken to MRI.

## 2014-09-30 NOTE — ED Notes (Signed)
Patient transported to CT 

## 2014-09-30 NOTE — Consult Note (Signed)
Referring Physician: Adriana Simas    Chief Complaint: TIA/CVA  HPI:                                                                                                                                         [name] is an 81 y.o. female who resides at [facility].  She was last seen normal yesterday.  Today she was in the hall when staff noted she was in a daze.  When they approached her they noted a right arm weakness and right facial droop. She was brought to ED where all her symptoms have resolved. She is on Eliquis for chro nic Afib and daughter states she has not had her does for today.   Date last known well: Date: 09/29/2014 Time last known well: Unable to determine tPA Given: No: symptoms resolved     Past Medical History  Diagnosis Date  . Hypertension   . Chronic atrial fibrillation   . Arthritis   . Chronic combined systolic and diastolic CHF (congestive heart failure)     a. Dx 2013, EF 45-50%, felt due in part to rapid AF.  Marland Kitchen Asthma   . Hypokalemia   . TIA (transient ischemic attack)     a. Question TIA during 2013 admission with word finding difficulty, already on Coumadin at the time.  . CKD (chronic kidney disease), stage III     a. Per review of chart, likely CKD stage III in 2013.Marland Kitchen    Past Surgical History  Procedure Laterality Date  . Other surgical history      bilateral hip replacement  . Joint replacement      bilateral hip replacement    Family History  Problem Relation Age of Onset  . Heart failure Mother   . Heart attack Sister   . Heart attack Brother   . Stroke Mother   . Stroke Maternal Aunt   . Hypertension Mother   . Hypertension Sister   . Hypertension Brother    Social History:  reports that she has never smoked. She does not have any smokeless tobacco history on file. She reports that she does not drink alcohol or use illicit drugs.  Allergies:  Allergies  Allergen Reactions  . Shrimp [Shellfish Allergy] Swelling    Medications:  Current Facility-Administered Medications  Medication Dose Route Frequency Provider Last Rate Last Dose  . 0.9 %  sodium chloride infusion   Intravenous Continuous Russella Dar, NP       Current Outpatient Prescriptions  Medication Sig Dispense Refill  . acetaminophen (TYLENOL) 325 MG tablet Take 2 tablets (650 mg total) by mouth every 6 (six) hours as needed for headache. 30 tablet 0  . alum & mag hydroxide-simeth (MAALOX/MYLANTA) 200-200-20 MG/5ML suspension Take 30 mLs by mouth every 4 (four) hours as needed for indigestion or heartburn. 355 mL 0  . apixaban (ELIQUIS) 2.5 MG TABS tablet Take 2.5 mg by mouth 2 (two) times daily.    Marland Kitchen diltiazem (CARDIZEM CD) 180 MG 24 hr capsule Take 1 capsule (180 mg total) by mouth daily. 30 capsule 0  . furosemide (LASIX) 20 MG tablet Take 20 mg by mouth daily.    . metoprolol tartrate (LOPRESSOR) 25 MG tablet Take 0.5 tablets (12.5 mg total) by mouth 2 (two) times daily. 60 tablet 0  . Oyster Shell (OYSTER CALCIUM) 500 MG TABS tablet Take 500 mg of elemental calcium by mouth 2 (two) times daily.    . potassium chloride SA (K-DUR,KLOR-CON) 20 MEQ tablet Take 20 mEq by mouth daily.    . prednisoLONE acetate (PRED FORTE) 1 % ophthalmic suspension Place 1 drop into both eyes 4 (four) times daily.    Marland Kitchen tiotropium (SPIRIVA) 18 MCG inhalation capsule Place 18 mcg into inhaler and inhale daily as needed (shortness of breath, wheezing).    Marland Kitchen HYDROcodone-acetaminophen (VICODIN) 5-500 MG per tablet Take 1 tablet by mouth every 6 (six) hours as needed for pain. 30 tablet 0     ROS:                                                                                                                                       History obtained from the patient  General ROS: negative for - chills, fatigue, fever, night sweats, weight gain or weight  loss Psychological ROS: negative for - behavioral disorder, hallucinations, memory difficulties, mood swings or suicidal ideation Ophthalmic ROS: negative for - blurry vision, double vision, eye pain or loss of vision ENT ROS: negative for - epistaxis, nasal discharge, oral lesions, sore throat, tinnitus or vertigo Allergy and Immunology ROS: negative for - hives or itchy/watery eyes Hematological and Lymphatic ROS: negative for - bleeding problems, bruising or swollen lymph nodes Endocrine ROS: negative for - galactorrhea, hair pattern changes, polydipsia/polyuria or temperature intolerance Respiratory ROS: negative for - cough, hemoptysis, shortness of breath or wheezing Cardiovascular ROS: negative for - chest pain, dyspnea on exertion, edema or irregular heartbeat Gastrointestinal ROS: negative for - abdominal pain, diarrhea, hematemesis, nausea/vomiting or stool incontinence Genito-Urinary ROS: negative for - dysuria, hematuria, incontinence or urinary frequency/urgency Musculoskeletal ROS: negative for - joint swelling or muscular weakness Neurological ROS: as noted in HPI Dermatological ROS: negative  for rash and skin lesion changes  Neurologic Examination:                                                                                                      Blood pressure 138/115, pulse 74, temperature 97.8 F (36.6 C), temperature source Oral, resp. rate 26, SpO2 93 %.  HEENT-  Normocephalic, no lesions, without obvious abnormality.  Normal external eye and conjunctiva.  Normal TM's bilaterally.  Normal auditory canals and external ears. Normal external nose, mucus membranes and septum.  Normal pharynx. Cardiovascular- irregularly irregular rhythm, pulses palpable throughout   Lungs- chest clear, no wheezing, rales, normal symmetric air entry Abdomen- normal findings: bowel sounds normal Extremities- no edema Lymph-no adenopathy palpable Musculoskeletal-no joint tenderness, deformity  or swelling Skin-warm and dry, no hyperpigmentation, vitiligo, or suspicious lesions  Neurological Examination Mental Status: Alert, oriented to hospital but cannot tell me the month and knows it is 2016. This is normal per daughter. Speech fluent without evidence of aphasia.  Able to follow 3 step commands without difficulty. Cranial Nerves: II: Discs flat bilaterally; Visual fields grossly normal, right pupil oval secondary to surgery and left pupil round, round, reactive to light and accommodation III,IV, VI: ptosis not present, extra-ocular motions intact bilaterally V,VII: smile symmetric, facial light touch sensation normal bilaterally VIII: hearing normal bilaterally IX,X: uvula rises symmetrically XI: bilateral shoulder shrug XII: midline tongue extension Motor: Right : Upper extremity   5/5    Left:     Upper extremity   5/5  Lower extremity   5/5     Lower extremity   5/5 Tone and bulk:normal tone throughout; no atrophy noted Sensory: Pinprick and light touch intact throughout, bilaterally Deep Tendon Reflexes: 2+ and symmetric throughout UE and left KJ no right KJ and bilateral 2+ AJ Plantars: Right: downgoing   Left: downgoing Cerebellar: normal finger-to-nose,and normal heel-to-shin test Gait: not tested due to multiple leads.        Lab Results: Basic Metabolic Panel:  Recent Labs Lab 09/30/14 1337 09/30/14 1347  NA 138 139  K 4.4 4.1  CL 103 102  CO2 25  --   GLUCOSE 120* 120*  BUN 26* 32*  CREATININE 1.58* 1.40*  CALCIUM 9.8  --     Liver Function Tests:  Recent Labs Lab 09/30/14 1337  AST 29  ALT 16  ALKPHOS 66  BILITOT 1.6*  PROT 6.3*  ALBUMIN 3.0*   No results for input(s): LIPASE, AMYLASE in the last 168 hours. No results for input(s): AMMONIA in the last 168 hours.  CBC:  Recent Labs Lab 09/30/14 1337 09/30/14 1347  WBC 15.6*  --   NEUTROABS 12.3*  --   HGB 13.7 15.0  HCT 41.7 44.0  MCV 87.2  --   PLT 241  --     Cardiac  Enzymes: No results for input(s): CKTOTAL, CKMB, CKMBINDEX, TROPONINI in the last 168 hours.  Lipid Panel: No results for input(s): CHOL, TRIG, HDL, CHOLHDL, VLDL, LDLCALC in the last 168 hours.  CBG: No results for input(s): GLUCAP in the last  168 hours.  Microbiology: Results for orders placed or performed during the hospital encounter of 04/13/14  MRSA PCR Screening     Status: None   Collection Time: 04/13/14  6:14 PM  Result Value Ref Range Status   MRSA by PCR NEGATIVE NEGATIVE Final    Comment:        The GeneXpert MRSA Assay (FDA approved for NASAL specimens only), is one component of a comprehensive MRSA colonization surveillance program. It is not intended to diagnose MRSA infection nor to guide or monitor treatment for MRSA infections.     Coagulation Studies:  Recent Labs  09/30/14 1337  LABPROT 20.7*  INR 1.78*    Imaging: Dg Chest 2 View  09/30/2014   CLINICAL DATA:  81 year old female with syncope today.  EXAM: CHEST  2 VIEW  COMPARISON:  04/14/2014 and prior radiographs  FINDINGS: Cardiomegaly is again identified.  Mild interstitial prominence is unchanged.  There is no evidence of focal airspace disease, pulmonary edema, suspicious pulmonary nodule/mass, pleural effusion, or pneumothorax.  No acute bony abnormalities are identified.  IMPRESSION: Cardiomegaly without evidence of acute cardiopulmonary disease.   Electronically Signed   By: Harmon Pier M.D.   On: 09/30/2014 14:36   Ct Head Wo Contrast  09/30/2014   CLINICAL DATA:  Patient found down after walking multiple blocks. Confused. No reported loss of consciousness.  EXAM: CT HEAD WITHOUT CONTRAST  TECHNIQUE: Contiguous axial images were obtained from the base of the skull through the vertex without intravenous contrast.  COMPARISON:  Brain CT 08/30/2011; MR brain 08/30/2011  FINDINGS: Left occipital lobe encephalomalacia with ex vacuo dilatation of the left lateral ventricle. Cortical atrophy. Chronic  small vessel ischemic changes. Right basal ganglia lacunar infarct. No evidence for acute cortically based infarct, intracranial hemorrhage, mass lesion or mass-effect. The orbits are unremarkable. The paranasal sinuses are unremarkable. The mastoid air cells are well aerated. Calvarium is intact.  IMPRESSION: Age-indeterminate but likely chronic left occipital lobe infarct.  Chronic small vessel ischemic changes and cortical atrophy.  No acute intracranial process.   Electronically Signed   By: Annia Belt M.D.   On: 09/30/2014 13:59       Assessment and plan discussed with with attending physician and they are in agreement.    Felicie Morn PA-C [facility] [phone]  09/30/2014, 3:46 PM     Stroke Risk Factors - atrial fibrillation and hypertension  Assessment: 81 y.o. female with a history of atrial fibrillation on anticoagulation with apixaban who presents with new onset right-sided numbness and dysarthria as well as weakness which is improving. I suspect that she has had a small stroke, though with improvement in symptoms TIAs also possible.  1. HgbA1c, fasting lipid panel 2. MRI, MRA  of the brain without contrast 3. Frequent neuro checks 4. Echocardiogram 5. Carotid dopplers 6. Prophylactic therapy- if after MRI, there is no moderate sized infarct, then could restart apixaban, otherwise aspirin. 7. Risk factor modification 8. Telemetry monitoring 9. PT consult, OT consult, Speech consult  Ritta Slot, MD [facility] [phone]  If 7pm- 7am, please page neurology on call as listed in AMION.

## 2014-09-30 NOTE — ED Notes (Signed)
Pt arrives from Independent living center at [facility]. Pt was found in the foyer, after ambulating a few blocks with walker, unable to speak, confused with right sided weakness and right sided facial droop. Pt states she had been at [employer]. Upon arrival pt has slurred speech, facial droop, expressive aphasia.

## 2014-09-30 NOTE — ED Notes (Signed)
Spoke with staff member, Lurena Joiner.  She states she only can very LSN as Tuesday.  Stated pt was c/o dizziness x 1 month.  Spoke with grandaughter who stated she felt pt was a little quieter than normal, but was ao x 4, per her norm.

## 2014-09-30 NOTE — ED Notes (Addendum)
Attempted to call pt's facility to establish last seen normal. Had to leave a message.

## 2014-10-01 ENCOUNTER — Inpatient Hospital Stay (HOSPITAL_BASED_OUTPATIENT_CLINIC_OR_DEPARTMENT_OTHER): Payer: Medicare Other

## 2014-10-01 DIAGNOSIS — I6789 Other cerebrovascular disease: Secondary | ICD-10-CM | POA: Diagnosis not present

## 2014-10-01 DIAGNOSIS — I69322 Dysarthria following cerebral infarction: Secondary | ICD-10-CM | POA: Diagnosis not present

## 2014-10-01 DIAGNOSIS — R29810 Facial weakness: Secondary | ICD-10-CM | POA: Diagnosis not present

## 2014-10-01 DIAGNOSIS — I482 Chronic atrial fibrillation: Secondary | ICD-10-CM

## 2014-10-01 DIAGNOSIS — D72829 Elevated white blood cell count, unspecified: Secondary | ICD-10-CM | POA: Diagnosis not present

## 2014-10-01 DIAGNOSIS — Z7901 Long term (current) use of anticoagulants: Secondary | ICD-10-CM | POA: Diagnosis not present

## 2014-10-01 DIAGNOSIS — I129 Hypertensive chronic kidney disease with stage 1 through stage 4 chronic kidney disease, or unspecified chronic kidney disease: Secondary | ICD-10-CM | POA: Diagnosis not present

## 2014-10-01 DIAGNOSIS — R2990 Unspecified symptoms and signs involving the nervous system: Secondary | ICD-10-CM | POA: Diagnosis not present

## 2014-10-01 DIAGNOSIS — I639 Cerebral infarction, unspecified: Secondary | ICD-10-CM

## 2014-10-01 DIAGNOSIS — I4581 Long QT syndrome: Secondary | ICD-10-CM

## 2014-10-01 LAB — URINE CULTURE

## 2014-10-01 LAB — LIPID PANEL
CHOL/HDL RATIO: 3.4 ratio
CHOLESTEROL: 142 mg/dL (ref 0–200)
HDL: 42 mg/dL (ref >40)
LDL Cholesterol: 86 mg/dL (ref 0–99)
TRIGLYCERIDES: 70 mg/dL (ref <150)
VLDL: 14 mg/dL (ref 0–40)

## 2014-10-01 MED ORDER — ATORVASTATIN CALCIUM 10 MG PO TABS
10.0000 mg | ORAL_TABLET | Freq: Every day | ORAL | Status: DC
  Administered 2014-10-01: 10 mg via ORAL
  Filled 2014-10-01: qty 1

## 2014-10-01 MED ORDER — CEPHALEXIN 250 MG PO CAPS
250.0000 mg | ORAL_CAPSULE | Freq: Two times a day (BID) | ORAL | Status: DC
  Administered 2014-10-01 – 2014-10-02 (×2): 250 mg via ORAL
  Filled 2014-10-01 (×2): qty 1

## 2014-10-01 MED ORDER — APIXABAN 2.5 MG PO TABS
2.5000 mg | ORAL_TABLET | Freq: Two times a day (BID) | ORAL | Status: DC
  Administered 2014-10-01 – 2014-10-02 (×3): 2.5 mg via ORAL
  Filled 2014-10-01 (×3): qty 1

## 2014-10-01 NOTE — Evaluation (Signed)
Speech Language Pathology Evaluation Patient Details Name: SUHANI STILLION MRN: [id number] DOB: March 25, 1918 Today's Date: 10/01/2014 Time: 1430-1500 SLP Time Calculation (min) (ACUTE ONLY): 30 min  Problem List:  Patient Active Problem List   Diagnosis Date Noted  . Facial droop   . CVA (cerebral infarction) 09/30/2014  . Leukocytosis 09/30/2014  . QT prolongation 09/30/2014  . Stroke-like symptoms   . Chronic combined systolic and diastolic CHF (congestive heart failure) 04/27/2014  . Symptomatic bradycardia 04/13/2014  . Chronic atrial fibrillation 04/13/2014  . Hyperkalemia 04/13/2014  . Acute renal failure superimposed on stage 3 chronic kidney disease 04/13/2014  . Complete heart block 04/13/2014  . ?TIA on medication, while Coumadin theraputic 03/22/2011  . Chronic anticoagulation 03/21/2011  . Hypokalemia 03/20/2011  . CHF, secondary to rapid AF and diastolic dysfunction. EF 45-50% 03/19/2011  . Asthma 03/19/2011  . Hypertension   . A-fib, persistant, rapid VR on admission   . Arthritis    Past Medical History:  Past Medical History  Diagnosis Date  . Hypertension   . Chronic atrial fibrillation   . Arthritis   . Chronic combined systolic and diastolic CHF (congestive heart failure)     a. Dx 2013, EF 45-50%, felt due in part to rapid AF.  Marland Kitchen Asthma   . Hypokalemia   . TIA (transient ischemic attack)     a. Question TIA during 2013 admission with word finding difficulty, already on Coumadin at the time.  . CKD (chronic kidney disease), stage III     a. Per review of chart, likely CKD stage III in 2013..  . Stroke    Past Surgical History:  Past Surgical History  Procedure Laterality Date  . Other surgical history      bilateral hip replacement  . Joint replacement      bilateral hip replacement   HPI:  81 year old female patient with history of TIA in 2013 while on Coumadin, hypertension, chronic atrial fibrillation on eliquis, arthritis, chronic combined  systolic and diastolic congestive heart failure with the systolic component felt to be related to rapid atrial fibrillation, asthma, stage III chronic kidney disease. Patient presents to the ER today after being found at her assisted living facility with right facial droop, dysarthria and right-sided weakness.    Assessment / Plan / Recommendation Clinical Impression  Patient was assessed by SLP for cognitive-linguistic and speech functioning. Initially, she appeared with more signficant impairment in her orientation and awareness, however as she became more alert during the course of the evaluation, clinician only observed a mild impairment of memory, with some difficutly in recalling new information, especially related to medical care. Reasoning, attention, safety awareness all appeared to be [facility]. Patient exhibited a mild flaccid dysarthria which affected her speech quality at sentence and conversational levels, however intelligibility was approximately 90% at conversational level. Patient's dysarthria is expected to continue to improve without formal speech therapy. Mild memory impairment may be baseline, and may be related to her 81 years old.    SLP Assessment  Patient does not need any further Speech Lanaguage Pathology Services    Follow Up Recommendations  None    Frequency and Duration   N/A     Pertinent Vitals/Pain Pain Assessment: No/denies pain   SLP Goals  Patient/Family Stated Goal: Patient would like to return to [facility] when she is able.  SLP Evaluation Prior Functioning  Cognitive/Linguistic Baseline: Baseline deficits Baseline deficit details: living at ALF, uses walker to ambulate  Lives With: Alone Available Help  at Discharge:  (ALF [facility]))   Cognition  Overall Cognitive Status: Impaired/Different from baseline Orientation Level: Oriented to person;Oriented to time;Oriented to situation;Disoriented to place Attention: Sustained;Selective Sustained  Attention: Appears intact Selective Attention: Appears intact Memory: Impaired Memory Impairment: Decreased recall of new information Awareness: Appears intact Executive Function: Reasoning Reasoning: Appears intact    Comprehension  Auditory Comprehension Overall Auditory Comprehension: Appears within functional limits for tasks assessed    Expression Expression Primary Mode of Expression: Verbal Verbal Expression Overall Verbal Expression: Appears within functional limits for tasks assessed Written Expression Dominant Hand: Right   Oral / Motor Oral Motor/Sensory Function Overall Oral Motor/Sensory Function: Impaired Labial Symmetry: Abnormal symmetry right Lingual ROM: Within Functional Limits Lingual Symmetry: Within Functional Limits Facial ROM: Reduced right Facial Symmetry: Right droop Motor Speech Overall Motor Speech: Impaired Respiration: Impaired Level of Impairment: Phrase Phonation: Normal Resonance: Within functional limits Articulation: Impaired Level of Impairment: Sentence Intelligibility: Intelligibility reduced Word: 75-100% accurate Phrase: 75-100% accurate Sentence: 75-100% accurate Conversation: 75-100% accurate Motor Planning: Witnin functional limits Motor Speech Errors: Not applicable Effective Techniques: Slow rate;Increased vocal intensity   GO Functional Limitations: Memory Motor Speech Current Status [phone]): At least 1 percent but less than 20 percent impaired, limited or restricted Motor Speech Goal Status [phone]): At least 1 percent but less than 20 percent impaired, limited or restricted Motor Speech Goal Status [phone]): At least 1 percent but less than 20 percent impaired, limited or restricted Memory Current Status (B1478): At least 1 percent but less than 20 percent impaired, limited or restricted Memory Goal Status (G9562): At least 1 percent but less than 20 percent impaired, limited or restricted Memory Discharge Status [phone]):  At least 1 percent but less than 20 percent impaired, limited or restricted   Angela Nevin, MA, CCC-SLP 10/01/2014 4:51 PM

## 2014-10-01 NOTE — Progress Notes (Signed)
Rehab Admissions Coordinator Note:  Patient was screened by Clois Dupes for appropriateness for an Inpatient Acute Rehab Consult per OT.  At this time, we are recommending Skilled Nursing Facility. [employer] Medicare will not approve an inpt rehab admission without a diagnosis of CVA. Discussed with Dr. Benjamine Mola and OT.  Clois Dupes 10/01/2014, 11:18 AM  I can be reached at [phone].

## 2014-10-01 NOTE — Progress Notes (Signed)
VASCULAR LAB PRELIMINARY  PRELIMINARY  PRELIMINARY  PRELIMINARY  Carotid duplex completed.    Preliminary report:  Bilateral:  1-39% ICA stenosis.  Vertebral artery flow is antegrade.     Brit Wernette, RVS 10/01/2014, 10:24 AM

## 2014-10-01 NOTE — Progress Notes (Signed)
  Echocardiogram 2D Echocardiogram has been performed.  Marchello Rothgeb 10/01/2014, 11:13 AM

## 2014-10-01 NOTE — Progress Notes (Addendum)
PROGRESS NOTE  [name] WUJ:[id number] DOB: [birth year] DOA: 09/30/2014 PCP: Ginette Otto, MD  Assessment/Plan: CVA (cerebral infarction) -MRI/MRA brain- no acute abnormality -resume eliquis -Echocardiogram: The right ventricular systolic pressure was increased consistent with moderate pulmonary hypertension. -carotid duplex: Bilateral: 1-39% ICA stenosis. Vertebral artery flow is antegrade -Lipid panel LDL 86 hemoglobin A1c -PT/OT/SLP: SNF  stage 3 chronic kidney disease -baseline appears to be 1.4-1.5   Chronic atrial fibrillation/QT prolongation  -Continue Cardizem and Lopressor since these are not implicated and QT prolongation -Repeat EKG: [id number]   Chronic combined systolic and diastolic CHF (congestive heart failure) -Compensated; in fact volume depleted  -will hold diuretics -daily weights, strict intake and output -Follow up on echocardiogram ordered for CVA diagnosis -will provide gentle hydration overnight   Leukocytosis -trend -Chest x-ray and lungs are clear on exam but could've experienced occult aspiration so continue to monitor respiratory status -if she spike fever, will repeat CXR after IVF's and then initiate abx's   Asthma -No wheezing on exam; condition is stable -Will continue home medication regimen     Code Status: DNR Family Communication: daughter updated at bedside Disposition Plan:    Consultants:  neuro  Procedures:     HPI/Subjective: Got SOB working with PT  Objective: Filed Vitals:   10/01/14 0826  BP: 144/95  Pulse:   Temp:   Resp:     Intake/Output Summary (Last 24 hours) at 10/01/14 0958 Last data filed at 10/01/14 0544  Gross per 24 hour  Intake      0 ml  Output    250 ml  Net   -250 ml   Filed Weights   10/01/14 0000 10/01/14 0500  Weight: 68.085 kg (150 lb 1.6 oz) 68.448 kg (150 lb 14.4 oz)    Exam:   General:  Awake, chronically ill appearing  Cardiovascular:  rrr  Respiratory: clear  Abdomen: +BS, soft  Musculoskeletal: min edema   Data Reviewed: Basic Metabolic Panel:  Recent Labs Lab 09/30/14 1337 09/30/14 1347  NA 138 139  K 4.4 4.1  CL 103 102  CO2 25  --   GLUCOSE 120* 120*  BUN 26* 32*  CREATININE 1.58* 1.40*  CALCIUM 9.8  --    Liver Function Tests:  Recent Labs Lab 09/30/14 1337  AST 29  ALT 16  ALKPHOS 66  BILITOT 1.6*  PROT 6.3*  ALBUMIN 3.0*   No results for input(s): LIPASE, AMYLASE in the last 168 hours. No results for input(s): AMMONIA in the last 168 hours. CBC:  Recent Labs Lab 09/30/14 1337 09/30/14 1347  WBC 15.6*  --   NEUTROABS 12.3*  --   HGB 13.7 15.0  HCT 41.7 44.0  MCV 87.2  --   PLT 241  --    Cardiac Enzymes:  Recent Labs Lab 09/30/14 1900  CKTOTAL 114   BNP (last 3 results) No results for input(s): BNP in the last 8760 hours.  ProBNP (last 3 results) No results for input(s): PROBNP in the last 8760 hours.  CBG: No results for input(s): GLUCAP in the last 168 hours.  No results found for this or any previous visit (from the past 240 hour(s)).   Studies: Dg Chest 2 View  09/30/2014   CLINICAL DATA:  81 year old female with syncope today.  EXAM: CHEST  2 VIEW  COMPARISON:  04/14/2014 and prior radiographs  FINDINGS: Cardiomegaly is again identified.  Mild interstitial prominence is unchanged.  There is no evidence of focal airspace disease, pulmonary edema,  suspicious pulmonary nodule/mass, pleural effusion, or pneumothorax.  No acute bony abnormalities are identified.  IMPRESSION: Cardiomegaly without evidence of acute cardiopulmonary disease.   Electronically Signed   By: Harmon Pier M.D.   On: 09/30/2014 14:36   Ct Head Wo Contrast  09/30/2014   CLINICAL DATA:  Patient found down after walking multiple blocks. Confused. No reported loss of consciousness.  EXAM: CT HEAD WITHOUT CONTRAST  TECHNIQUE: Contiguous axial images were obtained from the base of the skull through  the vertex without intravenous contrast.  COMPARISON:  Brain CT 08/30/2011; MR brain 08/30/2011  FINDINGS: Left occipital lobe encephalomalacia with ex vacuo dilatation of the left lateral ventricle. Cortical atrophy. Chronic small vessel ischemic changes. Right basal ganglia lacunar infarct. No evidence for acute cortically based infarct, intracranial hemorrhage, mass lesion or mass-effect. The orbits are unremarkable. The paranasal sinuses are unremarkable. The mastoid air cells are well aerated. Calvarium is intact.  IMPRESSION: Age-indeterminate but likely chronic left occipital lobe infarct.  Chronic small vessel ischemic changes and cortical atrophy.  No acute intracranial process.   Electronically Signed   By: Annia Belt M.D.   On: 09/30/2014 13:59   Mr Shirlee Latch Wo Contrast  09/30/2014   CLINICAL DATA:  81 year old female found with right facial droop, dysarthria, right side weakness. Initial encounter.  EXAM: MRI HEAD WITHOUT CONTRAST  MRA HEAD WITHOUT CONTRAST  TECHNIQUE: Multiplanar, multiecho pulse sequences of the brain and surrounding structures were obtained without intravenous contrast. Angiographic images of the head were obtained using MRA technique without contrast.  COMPARISON:  Head CT without contrast 1347 hr today. Brain MRI 08/30/2011.  FINDINGS: MRI HEAD FINDINGS  Major intracranial vascular flow voids are stable. No restricted diffusion to suggest acute infarction. No midline shift, mass effect, evidence of mass lesion, ventriculomegaly, extra-axial collection or acute intracranial hemorrhage. Cervicomedullary junction and pituitary are within normal limits.  Cerebral volume is not significantly changed since 2013. Patchy and confluent cerebral white matter T2 and FLAIR hyperintensity. There is now chronic encephalomalacia in the left occipital pole (series 10, image 6). Associated hemosiderin. T2 heterogeneity in the deep gray matter nuclei is stable. Chronic micro hemorrhage in the  cerebellar hemispheres has progressed since 2013. No other cortical encephalomalacia.  Negative visualized cervical spine. Bone marrow signal is stable and within normal limits. Visible internal auditory structures appear normal. Paranasal sinuses and mastoids are clear. Negative orbit and scalp soft tissues.  MRA HEAD FINDINGS  Study is mildly degraded by motion artifact despite repeated imaging attempts.  Dominant distal right vertebral artery. Antegrade flow in the posterior circulation. The left vertebral appears to functionally terminate in PICA. No basilar artery stenosis. SCA and left PCA origin are normal. Fetal type right PCA origin. Negative right PCA branches. Moderate irregularity and tandem stenosis in the left PCA P2 segment with preserved distal flow. Left posterior communicating artery is diminutive or absent.  Antegrade flow in both ICA siphons. Mild to moderate siphon irregularity but no hemodynamically significant stenosis. Ophthalmic and right posterior communicating artery origins are within normal limits. Patent carotid termini. MCA origins, M1 segments, and MCA bifurcations are within normal limits. No major MCA branch occlusion. Mild to moderate bilateral MCA branch irregularity.  Mild to moderate bilateral A1 ACA segment irregularity and stenosis. Diminutive or absent anterior communicating artery. Moderate to severe bilateral A2 segment stenosis with preserved distal flow.  IMPRESSION: 1.  No acute intracranial abnormality. 2. Chronic left PCA infarct and bilateral cerebellar micro hemorrhage have progressed  since 2013. Chronic small vessel disease. 3. Intracranial atherosclerosis but no major circle of Willis branch occlusion. Moderate to severe stenoses of the bilateral ACA A2 segments and left PCA P2 segment.   Electronically Signed   By: Odessa Fleming M.D.   On: 09/30/2014 18:46   Mr Brain Wo Contrast  09/30/2014   CLINICAL DATA:  81 year old female found with right facial droop,  dysarthria, right side weakness. Initial encounter.  EXAM: MRI HEAD WITHOUT CONTRAST  MRA HEAD WITHOUT CONTRAST  TECHNIQUE: Multiplanar, multiecho pulse sequences of the brain and surrounding structures were obtained without intravenous contrast. Angiographic images of the head were obtained using MRA technique without contrast.  COMPARISON:  Head CT without contrast 1347 hr today. Brain MRI 08/30/2011.  FINDINGS: MRI HEAD FINDINGS  Major intracranial vascular flow voids are stable. No restricted diffusion to suggest acute infarction. No midline shift, mass effect, evidence of mass lesion, ventriculomegaly, extra-axial collection or acute intracranial hemorrhage. Cervicomedullary junction and pituitary are within normal limits.  Cerebral volume is not significantly changed since 2013. Patchy and confluent cerebral white matter T2 and FLAIR hyperintensity. There is now chronic encephalomalacia in the left occipital pole (series 10, image 6). Associated hemosiderin. T2 heterogeneity in the deep gray matter nuclei is stable. Chronic micro hemorrhage in the cerebellar hemispheres has progressed since 2013. No other cortical encephalomalacia.  Negative visualized cervical spine. Bone marrow signal is stable and within normal limits. Visible internal auditory structures appear normal. Paranasal sinuses and mastoids are clear. Negative orbit and scalp soft tissues.  MRA HEAD FINDINGS  Study is mildly degraded by motion artifact despite repeated imaging attempts.  Dominant distal right vertebral artery. Antegrade flow in the posterior circulation. The left vertebral appears to functionally terminate in PICA. No basilar artery stenosis. SCA and left PCA origin are normal. Fetal type right PCA origin. Negative right PCA branches. Moderate irregularity and tandem stenosis in the left PCA P2 segment with preserved distal flow. Left posterior communicating artery is diminutive or absent.  Antegrade flow in both ICA siphons. Mild  to moderate siphon irregularity but no hemodynamically significant stenosis. Ophthalmic and right posterior communicating artery origins are within normal limits. Patent carotid termini. MCA origins, M1 segments, and MCA bifurcations are within normal limits. No major MCA branch occlusion. Mild to moderate bilateral MCA branch irregularity.  Mild to moderate bilateral A1 ACA segment irregularity and stenosis. Diminutive or absent anterior communicating artery. Moderate to severe bilateral A2 segment stenosis with preserved distal flow.  IMPRESSION: 1.  No acute intracranial abnormality. 2. Chronic left PCA infarct and bilateral cerebellar micro hemorrhage have progressed since 2013. Chronic small vessel disease. 3. Intracranial atherosclerosis but no major circle of Willis branch occlusion. Moderate to severe stenoses of the bilateral ACA A2 segments and left PCA P2 segment.   Electronically Signed   By: Odessa Fleming M.D.   On: 09/30/2014 18:46    Scheduled Meds: . diltiazem  180 mg Oral Daily  . enoxaparin (LOVENOX) injection  30 mg Subcutaneous Q24H  . metoprolol tartrate  12.5 mg Oral BID  . prednisoLONE acetate  1 drop Both Eyes QID   Continuous Infusions: . sodium chloride 75 mL/hr at 09/30/14 2121   Antibiotics Given (last 72 hours)    None      Principal Problem:   CVA (cerebral infarction) Active Problems:   Asthma   Chronic anticoagulation   Chronic atrial fibrillation   Acute renal failure superimposed on stage 3 chronic kidney disease  Chronic combined systolic and diastolic CHF (congestive heart failure)   Leukocytosis   QT prolongation   Stroke-like symptoms    Time spent: 25 min    VANN, JESSICA  [facility] Pager [phone]. If 7PM-7AM, please contact night-coverage at [email or web address], password [facility] 10/01/2014, 9:58 AM  LOS: 1 day

## 2014-10-01 NOTE — Evaluation (Signed)
Occupational Therapy Evaluation Patient Details Name: [name] MRN: [id number] DOB: 04-22-1918 Today's Date: 10/01/2014    History of Present Illness 81 year old female patient with history of TIA in 2013 while on Coumadin, hypertension, chronic atrial fibrillation on eliquis, arthritis, chronic combined systolic and diastolic congestive heart failure with the systolic component felt to be related to rapid atrial fibrillation, asthma, stage III chronic kidney disease. Patient presents to the ER after being found at her assisted living facility with right facial droop, dysarthria and right-sided weakness. According to the daughter and home health nurse at the assisted living facility was alerted that the patient had not been seen since lunch the day before and did not report to breakfast. Someone was sent to the patient's room to check on her and found her on the floor; she had been there for unknown period of time. At that time it was noticed that the patient had slurred speech with right-sided weakness and right-sided facial droop. She was last seen normal again at lunch the day before and there was a report she may have been experiencing dizziness 2 days prior.   Clinical Impression   Pt admitted with above and presents with decreased activity tolerance, endurance, impaired balance, and BUE weakness.  Pt required mod assist sit > stand and LB self-care tasks secondary to above and O2 would drop with activity requiring multiple rest breaks and cues for breathing.  Pt would benefit from continued OT services to address above and increase independence with ADLs to return to [facility].    Follow Up Recommendations  CIR   Equipment Recommendations  Other (comment) (TBD)    Recommendations for Other Services Rehab consult     Precautions / Restrictions Precautions Precautions: Fall Restrictions Weight Bearing Restrictions: No      Mobility Bed Mobility Overal bed mobility: Needs  Assistance Bed Mobility: Sidelying to Sit;Sit to Supine   Sidelying to sit: Min guard   Sit to supine: Min guard   General bed mobility comments: from flat bed without rails  Transfers Overall transfer level: Needs assistance Equipment used: Rolling walker (2 wheeled) Transfers: Sit to/from [employer] Sit to Stand: Mod assist Stand pivot transfers: Mod assist       General transfer comment: Pt required mod assist for anterior weight shift with sit > stand.         ADL Overall ADL's : Needs assistance/impaired     Grooming: Wash/dry hands;Wash/dry face;Set up;Sitting       Lower Body Bathing: Moderate assistance;Sit to/from stand       Lower Body Dressing: Moderate assistance;Sit to/from stand   Toilet Transfer: Moderate assistance;Stand-pivot;RW;BSC Toilet Transfer Details (indicate cue type and reason): tactile cues to promote forward weight shift with sit > stand.            Functional mobility during ADLs: Moderate assistance;Rolling walker General ADL Comments: Pt with SOB and decreased endurance, requiring cues for pursed lip breathing during activity.     Vision Vision Assessment?: No apparent visual deficits          Pertinent Vitals/Pain Pain Assessment: No/denies pain     Hand Dominance Right   Extremity/Trunk Assessment Upper Extremity Assessment Upper Extremity Assessment: Generalized weakness (strength grossly 4/5, loose gross grasp in BUE)   Lower Extremity Assessment Lower Extremity Assessment: Defer to PT evaluation       Communication Communication Communication: No difficulties   Cognition Arousal/Alertness: Awake/alert Behavior During Therapy: WFL for tasks assessed/performed Overall Cognitive Status:  Within Functional Limits for tasks assessed                                Home Living Family/patient expects to be discharged to:: Assisted living                             Home  Equipment: Dan Humphreys - 2 wheels;Shower seat;Grab bars - toilet;Grab bars - tub/shower          Prior Functioning/Environment Level of Independence: Independent with assistive device(s)        Comments: Pt reports able to complete bathing and dressing without assist, did not cook, clean, or drive.      OT Diagnosis: Generalized weakness;Hemiplegia dominant side   OT Problem List: Decreased strength;Decreased range of motion;Decreased activity tolerance;Impaired balance (sitting and/or standing);Decreased coordination;Impaired UE functional use   OT Treatment/Interventions: Self-care/ADL training;Neuromuscular education;Energy conservation;DME and/or AE instruction;Therapeutic activities;Patient/family education;Balance training    OT Goals(Current goals can be found in the care plan section) Acute Rehab OT Goals Patient Stated Goal: to return home OT Goal Formulation: With patient Time For Goal Achievement: 10/15/14 Potential to Achieve Goals: Good ADL Goals Pt Will Perform Grooming: with modified independence;standing Pt Will Perform Upper Body Bathing: with modified independence;sitting Pt Will Perform Lower Body Bathing: with modified independence;sit to/from stand Pt Will Perform Upper Body Dressing: with modified independence;sitting Pt Will Perform Lower Body Dressing: with modified independence;sit to/from stand Pt Will Transfer to Toilet: with modified independence;ambulating Pt Will Perform Toileting - Clothing Manipulation and hygiene: with modified independence;sit to/from stand  OT Frequency: Min 2X/week   Barriers to D/C:               End of Session Equipment Utilized During Treatment: Gait belt;Rolling walker Nurse Communication: Mobility status  Activity Tolerance: Patient tolerated treatment well;No increased pain;Patient limited by fatigue Patient left: in bed;with nursing/sitter in room   Time: 0820-0852 OT Time Calculation (min): 32 min Charges:  OT  General Charges $OT Visit: 1 Procedure OT Evaluation $Initial OT Evaluation Tier I: 1 Procedure OT Treatments $Self Care/Home Management : 8-22 mins G-CodesRosalio Loud, [phone] 10/01/2014, 10:00 AM

## 2014-10-01 NOTE — Progress Notes (Signed)
STROKE TEAM PROGRESS NOTE  HPI [name] is an 81 y.o. female who resides at [facility]. She was last seen normal yesterday. Today she was in the hall when staff noted she was in a daze. When they approached her they noted a right arm weakness and right facial droop. She was brought to ED where all her symptoms have resolved. She is on Eliquis for chronic Afib and daughter states she has not had her does for today.   Date last known well: Date: 09/29/2014 Time last known well: Unable to determine tPA Given: No: symptoms resolved     SUBJECTIVE (INTERVAL HISTORY) No family members present. The patient is not oriented to the date. Dr. Pearlean Brownie explained that the imaging did not show a stroke. This may have been a TIA.   OBJECTIVE Temp:  [97.8 F (36.6 C)-99.7 F (37.6 C)] 98.2 F (36.8 C) (08/26 1025) Pulse Rate:  [56-140] 81 (08/26 1358) Cardiac Rhythm:  [-] Atrial flutter (08/26 0900) Resp:  [14-26] 18 (08/26 1025) BP: (122-166)/(71-115) 139/100 mmHg (08/26 1025) SpO2:  [88 %-99 %] 88 % (08/26 1358) Weight:  [68.085 kg (150 lb 1.6 oz)-68.448 kg (150 lb 14.4 oz)] 68.448 kg (150 lb 14.4 oz) (08/26 0500)     Component Value Date/Time   CHOL 142 10/01/2014 0425   TRIG 70 10/01/2014 0425   HDL 42 10/01/2014 0425   CHOLHDL 3.4 10/01/2014 0425   VLDL 14 10/01/2014 0425   LDLCALC 86 10/01/2014 0425   No results found for: HGBA1C    Component Value Date/Time   LABOPIA NONE DETECTED 09/30/2014 1623   COCAINSCRNUR NONE DETECTED 09/30/2014 1623   LABBENZ NONE DETECTED 09/30/2014 1623   AMPHETMU NONE DETECTED 09/30/2014 1623   THCU NONE DETECTED 09/30/2014 1623   LABBARB NONE DETECTED 09/30/2014 1623      IMAGING  Dg Chest 2 View 09/30/2014    Cardiomegaly without evidence of acute cardiopulmonary disease.       Ct Head Wo Contrast 09/30/2014     Age-indeterminate but likely chronic left occipital lobe infarct.  Chronic small vessel ischemic changes and cortical  atrophy.  No acute intracranial process.     Mr Maxine Glenn Head Wo Contrast 09/30/2014    1.  No acute intracranial abnormality.  2. Chronic left PCA infarct and bilateral cerebellar micro hemorrhage have progressed since 2013. Chronic small vessel disease.  3. Intracranial atherosclerosis but no major circle of Willis branch occlusion. Moderate to severe stenoses of the bilateral ACA A2 segments and left PCA P2 segment.     2-D echocardiogram 10/01/2014 Study Conclusions - Left ventricle: The cavity size was normal. There was moderate concentric hypertrophy. Systolic function was normal. Wall motion was normal; there were no regional wall motion abnormalities. - Aortic valve: Severe diffuse thickening. Severe diffuse calcification. There was mild to moderate stenosis. There was mild regurgitation. Valve area (VTI): 0.69 cm^2. Valve area (Vmax): 0.73 cm^2. Valve area (Vmean): 0.63 cm^2. - Mitral valve: Thickened and calcified papillary muscles. Mild thickening and calcification. There was mild regurgitation. - Tricuspid valve: There was mild-moderate regurgitation. - Pulmonic valve: There was mild regurgitation. - Pulmonary arteries: PA peak pressure: 45 mm Hg (S). Impressions: - The right ventricular systolic pressure was increased consistent with moderate pulmonary hypertension.    PHYSICAL EXAM Afebrile. Head is nontraumatic. Neck is supple without bruit.    Cardiac exam no murmur or gallop. Lungs are clear to auscultation. Distal pulses are well felt.  Neurological Exam :  Awake  Alert oriented x 3. Diminished recall and registration. Normal speech and language.eye movements full without nystagmus.fundi were not visualized. Vision acuity and fields appear normal. Hearing is normal. Palatal movements are normal. Face symmetric. Tongue midline. Normal strength, tone, reflexes and coordination. Normal sensation. Gait deferred.     ASSESSMENT/PLAN Ms. BALERIA WYMAN is a  81 y.o. female with history of hypertension, chronic atrial fibrillation on Eliquis, hypokalemia, chronic kidney disease, and previous stroke presenting with right arm and facial weakness.  She did not receive IV t-PA due to resolution of deficits and Eliquis therapy.  TIA:  Dominant   Resultant  resolution of deficits  MRI  no acute abnormality.  MRA - Moderate to severe stenoses of the bilateral ACA A2 segments and left PCA P2 segment.  Carotid Doppler Bilateral: 1-39% ICA stenosis. Vertebral artery flow is antegrade.  2D Echo normal systolic function. No cardiac source of emboli identified.  LDL 86  HgbA1c pending  Eliquis for VTE prophylaxis Diet Heart Room service appropriate?: Yes; Fluid consistency:: Thin  eliquis (apixaban) prior to admission, now on eliquis (apixaban)  Patient counseled to be compliant with her antithrombotic medications  Ongoing aggressive stroke risk factor management  Therapy recommendations:  SNF  Disposition:  Pending  Hypertension  Stable  Permissive hypertension (OK if < 220/120) but gradually normalize in 5-7 days  Hyperlipidemia  Home meds: No lipid lowering medications prior to admission  LDL 86, goal < 70  Add Lipitor 10 mg daily  Continue statin at discharge  Diabetes  HgbA1c pending, goal < 7.0  No history of diabetes mellitus  Other Stroke Risk Factors  Advanced age  Hx stroke/TIA  Family hx stroke (Mother)  Atrial fibrillation  Other Active Problems mother  Leukocytosis - WBC 15.6 - repeat CBC and a.m.  Renal insufficiency  BUN 32   creatinine 1.40  Moderate pulmonary hypertension by 2-D echo  Mild to moderate aortic stenosis by 2-D echo.  Other Pertinent History    Hospital day # 1  Delton See PA-C [facility] Pager [phone] 10/01/2014, 2:59 PM I have personally examined this patient, reviewed notes, independently viewed imaging studies, participated in medical decision  making and plan of care. I have made any additions or clarifications directly to the above note. Agree with note above. She presented with transient right face and arm weakness due to left hemispheric TIA likely of cardioembolic embolic etiology from atrial fibrillation despite being on anticoagulation with eliquis. She remains at risk for neurological worsening, recurrent stroke, TIA needs ongoing stroke evaluation and aggressive risk factor control. I explained to the patient the lack of data supporting switching eliquis  to Pradaxa or Xarelto or about adding aspirin and hence recommend continuing eliquis in the current dose.  Delia Heady, MD Medical Director [facility] Pager: [phone] 10/01/2014 4:15 PM     To contact Stroke Continuity provider, please refer to [email or web address]. After hours, contact General Neurology

## 2014-10-01 NOTE — Evaluation (Signed)
Physical Therapy Evaluation Patient Details Name: [name] MRN: [id number] DOB: [birth year] Today's Date: 10/01/2014   History of Present Illness  81 y.o. female admitted to [facility] on 09/30/14 for right facial droop, dysarthria, and right sided weakness.  Stroke workup pending, however, MRI negative for acute infarct.  Pt with significant PMHx of HTN, A-fib, CHF, TIA, CKD, stroke, and bil THA.   Clinical Impression  Pt is up to mod assist for gait with RW, unaware of bowel incontinence, and cannot report to me where she was living PTA.  She is a very high fall risk without hands on assist anytime she is up and moving.  SNF would be safest d/c destination at this time.   PT to follow acutely for deficits listed below.       Follow Up Recommendations SNF    Equipment Recommendations  None recommended by PT    Recommendations for Other Services   NA    Precautions / Restrictions Precautions Precautions: Fall      Mobility  Bed Mobility Overal bed mobility: Needs Assistance Bed Mobility: Supine to Sit   Sidelying to sit: Min guard   Sit to supine: Min assist   General bed mobility comments: Min guard assist to prevent posterior LOB while scooting to EOB, min assist of feet to get back into bed.   Transfers Overall transfer level: Needs assistance Equipment used: Rolling walker (2 wheeled) Transfers: Sit to/from Stand Sit to Stand: Mod assist Stand pivot transfers: Mod assist       General transfer comment: Mod assist to support trunk during transitions.  Pt with tendancy for posterior LOB.   Ambulation/Gait Ambulation/Gait assistance: Mod assist Ambulation Distance (Feet): 20 Feet Assistive device: Rolling walker (2 wheeled) Gait Pattern/deviations: Step-through pattern;Decreased weight shift to right;Decreased stance time - right;Leaning posteriorly     General Gait Details: Pt tends to have delayed progression of right leg and is soft at her knee.  Mod assist to  support trunk for balance, when she does loses her balance it tends to be backwards.       Modified Rankin (Stroke Patients Only) Modified Rankin (Stroke Patients Only) Pre-Morbid Rankin Score: Moderate disability Modified Rankin: Moderately severe disability     Balance Overall balance assessment: Needs assistance Sitting-balance support: Feet unsupported;Bilateral upper extremity supported Sitting balance-Leahy Scale: Poor Sitting balance - Comments: with feet unsupported posterior LOB requiring min assist, with feet supported and bil upper extremities supervision for safety Postural control: Posterior lean Standing balance support: Bilateral upper extremity supported Standing balance-Leahy Scale: Poor                               Pertinent Vitals/Pain Pain Assessment: No/denies pain    Home Living Family/patient expects to be discharged to:: Assisted living               Home Equipment: Walker - 2 wheels;Shower seat;Grab bars - toilet;Grab bars - tub/shower      Prior Function Level of Independence: Independent with assistive device(s)         Comments: Pt reports able to complete bathing and dressing without assist, did not cook, clean, or drive.   (per OT eval)     Hand Dominance   Dominant Hand: Right    Extremity/Trunk Assessment   Upper Extremity Assessment: Defer to OT evaluation           Lower Extremity Assessment: RLE deficits/detail RLE Deficits /  Details: functionally, right leg is weaker during gait and doesn't buckle, but has more give to it in stance than left leg.     Cervical / Trunk Assessment: Normal  Communication   Communication: No difficulties  Cognition Arousal/Alertness: Awake/alert Behavior During Therapy: WFL for tasks assessed/performed Overall Cognitive Status: No family/caregiver present to determine baseline cognitive functioning       Memory: Decreased short-term memory (unable to tell me where she  lives)              General Comments General comments (skin integrity, edema, etc.): pt was unaware of bowel incontinence on the way to the bathroom.           Assessment/Plan    PT Assessment Patient needs continued PT services  PT Diagnosis Difficulty walking;Abnormality of gait;Generalized weakness   PT Problem List Decreased strength;Decreased activity tolerance;Decreased balance;Decreased mobility;Decreased knowledge of use of DME;Decreased cognition  PT Treatment Interventions DME instruction;Gait training;Functional mobility training;Therapeutic activities;Therapeutic exercise;Balance training;Neuromuscular re-education;Patient/family education   PT Goals (Current goals can be found in the Care Plan section) Acute Rehab PT Goals Patient Stated Goal: to go home PT Goal Formulation: With patient Time For Goal Achievement: 10/15/14 Potential to Achieve Goals: Good    Frequency Min 2X/week           End of Session Equipment Utilized During Treatment: Gait belt Activity Tolerance: Patient tolerated treatment well Patient left: in bed;with call bell/phone within reach           Time: 1355-1430 PT Time Calculation (min) (ACUTE ONLY): 35 min   Charges:   PT Evaluation $Initial PT Evaluation Tier I: 1 Procedure PT Treatments $Therapeutic Activity: 8-22 mins        Emya Picado B. Anamarie Hunn, PT, DPT [phone]   10/01/2014, 3:04 PM

## 2014-10-01 NOTE — Progress Notes (Signed)
Pt daughter, who is a Engineer, civil (consulting) at another facility, at bedside, 2000hrs and questioning current treatment plan, while discussing scheduled medications, order for [place]  IVPB was questioned as to why was the pt being administered this medication. Contacted on call hospitalist and was directed to contact on call Neurologist which was done. On call Neurologist advised to hold medication tonight for clarification in A.M.

## 2014-10-01 NOTE — Care Management Note (Signed)
Case Management Note  Patient Details  Name: [name] MRN: [id number] Date of Birth: 11/30/1918  Subjective/Objective:                    Action/Plan: Patient is from [facility]. Plan is for discharge to SNF. CSW following. CM will follow for any additional discharge needs.  Expected Discharge Date:                  Expected Discharge Plan:  Skilled Nursing Facility  In-House Referral:  Clinical Social Work  Discharge planning Services     Post Acute Care Choice:    Choice offered to:  Patient  DME Arranged:    DME Agency:     HH Arranged:    HH Agency:     Status of Service:  Completed, signed off  Medicare Important Message Given:    Date Medicare IM Given:    Medicare IM give by:    Date Additional Medicare IM Given:    Additional Medicare Important Message give by:     If discussed at Long Length of Stay Meetings, dates discussed:    Additional Comments:  Anda Kraft, RN 10/01/2014, 4:17 PM

## 2014-10-02 DIAGNOSIS — R2990 Unspecified symptoms and signs involving the nervous system: Secondary | ICD-10-CM | POA: Diagnosis not present

## 2014-10-02 DIAGNOSIS — I482 Chronic atrial fibrillation: Secondary | ICD-10-CM | POA: Diagnosis not present

## 2014-10-02 LAB — BASIC METABOLIC PANEL
ANION GAP: 7 (ref 5–15)
BUN: 18 mg/dL (ref 6–20)
CHLORIDE: 103 mmol/L (ref 101–111)
CO2: 28 mmol/L (ref 22–32)
CREATININE: 1.24 mg/dL — AB (ref 0.44–1.00)
Calcium: 8.9 mg/dL (ref 8.9–10.3)
GFR calc non Af Amer: 36 mL/min — ABNORMAL LOW (ref >60)
GFR, EST AFRICAN AMERICAN: 42 mL/min — AB (ref >60)
Glucose, Bld: 109 mg/dL — ABNORMAL HIGH (ref 65–99)
Potassium: 3.9 mmol/L (ref 3.5–5.1)
SODIUM: 138 mmol/L (ref 135–145)

## 2014-10-02 LAB — CBC
HEMATOCRIT: 39.8 % (ref 36.0–46.0)
HEMOGLOBIN: 12.7 g/dL (ref 12.0–15.0)
MCH: 28.5 pg (ref 26.0–34.0)
MCHC: 31.9 g/dL (ref 30.0–36.0)
MCV: 89.2 fL (ref 78.0–100.0)
Platelets: 241 10*3/uL (ref 150–400)
RBC: 4.46 MIL/uL (ref 3.87–5.11)
RDW: 13.4 % (ref 11.5–15.5)
WBC: 11.9 10*3/uL — AB (ref 4.0–10.5)

## 2014-10-02 LAB — HEMOGLOBIN A1C
HEMOGLOBIN A1C: 5.8 % — AB (ref 4.8–5.6)
Mean Plasma Glucose: 120 mg/dL

## 2014-10-02 MED ORDER — APIXABAN 5 MG PO TABS: 5.0000 mg | ORAL_TABLET | Freq: Two times a day (BID) | ORAL | Status: AC

## 2014-10-02 MED ORDER — APIXABAN 5 MG PO TABS: 5.0000 mg | ORAL_TABLET | Freq: Two times a day (BID) | ORAL | Status: DC

## 2014-10-02 MED ORDER — ATORVASTATIN CALCIUM 10 MG PO TABS: 10.0000 mg | ORAL_TABLET | Freq: Every day | ORAL | Status: DC

## 2014-10-02 MED ORDER — FUROSEMIDE 20 MG PO TABS: 20.0000 mg | ORAL_TABLET | ORAL | Status: AC

## 2014-10-02 MED ORDER — CEPHALEXIN 250 MG PO CAPS: 250.0000 mg | ORAL_CAPSULE | Freq: Two times a day (BID) | ORAL | Status: DC

## 2014-10-02 MED ORDER — APIXABAN 2.5 MG PO TABS
2.5000 mg | ORAL_TABLET | ORAL | Status: AC
  Administered 2014-10-02: 2.5 mg via ORAL
  Filled 2014-10-02: qty 1

## 2014-10-02 NOTE — Clinical Social Work Note (Signed)
Clinical Social Work Assessment  Patient Details  Name: [name] MRN: [id number] Date of Birth: February 29, 1918  Date of referral:  10/01/14               Reason for consult:                   Permission sought to share information with:    Permission granted to share information::  Yes, Verbal Permission Granted  Name::        Agency::     Relationship::     Contact Information:     Housing/Transportation Living arrangements for the past 2 months:  Independent Living Facility Source of Information:  Patient, Adult Children (Patient's daughter present; son will transport back to facility) Patient Interpreter Needed:  None Criminal Activity/Legal Involvement Pertinent to Current Situation/Hospitalization:  No - Comment as needed Significant Relationships:  Adult Children Lives with:  Self, Facility Resident Do you feel safe going back to the place where you live?  Yes Need for family participation in patient care:  Yes (Comment)  Care giving concerns: Patient's daughter asked about insurance coverage. This CSW advised her to seek that information from facility once they obtain authorization.    Social Worker assessment / plan: CSW completed assessment and is managing discharge to facility.  Patient's son will provide transportation.   Employment status:  Retired Database administrator PT Recommendations:  Skilled Nursing Facility Information / Referral to community resources:  Skilled Nursing Facility  Patient/Family's Response to care: Family and patient in agreement with plan and looking forward to transition to [employer]  Patient/Family's Understanding of and Emotional Response to Diagnosis, Current Treatment, and Prognosis: Patient and family looking forward to patient returning to her baseline  Emotional Assessment Appearance:  Appears stated age Attitude/Demeanor/Rapport:   (pleasant) Affect (typically observed):  Accepting, Appropriate Orientation:   Oriented to Self, Oriented to Place, Oriented to  Time, Oriented to Situation Alcohol / Substance use:  Not Applicable Psych involvement (Current and /or in the community):  No (Comment)  Discharge Needs  Concerns to be addressed:  No discharge needs identified Readmission within the last 30 days:  No Current discharge risk:  None Barriers to Discharge:  No Barriers Identified   Leatta Alewine, Harrel Lemon, LCSW 10/02/2014, 11:38 AM

## 2014-10-02 NOTE — Progress Notes (Signed)
The stroke team will sign off at this time. Please call if we can be of further service.  Delton See PA-C [facility] Pager [phone] 10/02/2014, 10:58 AM

## 2014-10-02 NOTE — Care Management Note (Signed)
Case Management Note  Patient Details  Name: [name] MRN: [id number] Date of Birth: 11-05-1918  Subjective/Objective:                    Action/Plan:   Expected Discharge Date:                  Expected Discharge Plan:  Skilled Nursing Facility  In-House Referral:  Clinical Social Work  Discharge planning Services     Post Acute Care Choice:    Choice offered to:  Patient  DME Arranged:    DME Agency:     HH Arranged:    HH Agency:     Status of Service:  Completed, signed off  Medicare Important Message Given:   yes Date Medicare IM Given:   10/02/14 Medicare IM give by:   Meryl Crutch, RN, BSN Date Additional Medicare IM Given:    Additional Medicare Important Message give by:     If discussed at Long Length of Stay Meetings, dates discussed:    Additional Comments:  Isaias Cowman, RN 10/02/2014, 10:19 AM

## 2014-10-02 NOTE — Progress Notes (Signed)
Called [facility] to give report for patient only got a recording and was able to leave message for call back, other wise patient is alert IV is removed and patient is ready to be transported as soon as Child psychotherapist gives ok.  Durward Parcel from [employer] called back and report was given.

## 2014-10-02 NOTE — Discharge Summary (Signed)
Physician Discharge Summary  [name]:[id number] DOB: 04/20/1918 DOA: 09/30/2014  PCP: Ginette Otto, MD  Admit date: 09/30/2014 Discharge date: 10/02/2014  Time spent: 35 minutes  Recommendations for Outpatient Follow-up:  1. BMP weekly 2. Stop keflex 8/30  Discharge Diagnoses:  Principal Problem:   CVA (cerebral infarction) Active Problems:   Asthma   Chronic anticoagulation   Chronic atrial fibrillation   Acute renal failure superimposed on stage 3 chronic kidney disease   Chronic combined systolic and diastolic CHF (congestive heart failure)   Leukocytosis   QT prolongation   Stroke-like symptoms   Facial droop   Discharge Condition: improved  Diet recommendation: cardiac  Filed Weights   10/01/14 0000 10/01/14 0500 10/02/14 0500  Weight: 68.085 kg (150 lb 1.6 oz) 68.448 kg (150 lb 14.4 oz) 68.493 kg (151 lb)    History of present illness:  This is a 81 year old female patient with history of TIA in 2013 while on Coumadin, hypertension, chronic atrial fibrillation on eliquis, arthritis, chronic combined systolic and diastolic congestive heart failure with the systolic component felt to be related to rapid atrial fibrillation, asthma, stage III chronic kidney disease. Patient presents to the ER today after being found at her assisted living facility with right facial droop, dysarthria and right-sided weakness. According to the daughter and home health nurse at the assisted living facility was alerted that the patient had not been seen since lunch the day before and did not report to breakfast today. Someone was sent to the patient's room to check on her and found her on the floor; he had been there for unknown period of time. At that time it was noticed that the patient had slurred speech with right-sided weakness and right-sided facial droop. She was last seen normal again at lunch the day before and there was a report she may have been experiencing dizziness  2 days prior.  In the ER patient was afebrile, pulse was regular, blood pressure was 104/71, room air saturations were 96%. A CT of the head was completed that demonstrated age indeterminate but likely chronic left occipital lobe infarct with chronic small vessel ischemic changes and cortical atrophy but no acute intracranial process. Left lower panel unremarkable except for mild acute renal insufficiency with a BUN of 32 and a creatinine of 1.4 with previous baseline earlier in the year of BUN 15 creatinine 1.06. Troponin was normal, total bilirubin was slightly elevated at 1.6 with otherwise normal LFTs, glucose slightly elevated at 120. Asian had leukocytosis with a white count 15,600 and neutrophils 79%. PT 20.7 and INR 1.78 with PTT 37. Serum alcohol was less than 5, chest x-ray revealed cardiomegaly without evidence of acute cardiopulmonary disease.  At the time I interviewed and examined the patient right-sided weakness had resolved, she continued with right facial drooping as well as slurred speech and dysarthria and mild expressive aphasia. She is not had any constitutional symptoms such as fevers chills nausea vomiting abdominal pain diarrhea.  Hospital Course:  CVA (cerebral infarction) -MRI/MRA brain- no acute abnormality -resume eliquis -Echocardiogram: The right ventricular systolic pressure was increased consistent with moderate pulmonary hypertension. -carotid duplex: Bilateral: 1-39% ICA stenosis. Vertebral artery flow is antegrade -Lipid panel LDL 86- statin hemoglobin W1X 5.8 -PT/OT/SLP: SNF  stage 3 chronic kidney disease -baseline appears to be 1.4-1.5   Chronic atrial fibrillation/QT prolongation  -Continue Cardizem and Lopressor since these are not implicated and QT prolongation -Repeat EKG: [id number]   Chronic combined systolic and diastolic CHF (congestive  heart failure) -Compensated; in fact volume depleted  -resume diuretic QOD   Leukocytosis with UTI-  nothing on culture -decreasing -started kelfex BID x 3 days -Chest x-ray and lungs are clear on exam   Asthma -No wheezing on exam; condition is stable -Will continue home medication regimen  Procedures:    Consultations:  neuro  Discharge Exam: Filed Vitals:   10/02/14 0936  BP: 122/65  Pulse:   Temp: 97.9 F (36.6 C)  Resp: 20    General: sleepy Cardiovascular: rrr Respiratory: clear  Discharge Instructions   Discharge Instructions    Diet - low sodium heart healthy    Complete by:  As directed      Discharge instructions    Complete by:  As directed   Weekly BMP PRN O2     Increase activity slowly    Complete by:  As directed           Current Discharge Medication List    START taking these medications   Details  atorvastatin (LIPITOR) 10 MG tablet Take 1 tablet (10 mg total) by mouth daily at 6 PM.    cephALEXin (KEFLEX) 250 MG capsule Take 1 capsule (250 mg total) by mouth every 12 (twelve) hours.      CONTINUE these medications which have CHANGED   Details  apixaban (ELIQUIS) 5 MG TABS tablet Take 1 tablet (5 mg total) by mouth 2 (two) times daily. Qty: 60 tablet    furosemide (LASIX) 20 MG tablet Take 1 tablet (20 mg total) by mouth every other day. Qty: 30 tablet      CONTINUE these medications which have NOT CHANGED   Details  acetaminophen (TYLENOL) 325 MG tablet Take 2 tablets (650 mg total) by mouth every 6 (six) hours as needed for headache. Qty: 30 tablet, Refills: 0    alum & mag hydroxide-simeth (MAALOX/MYLANTA) 200-200-20 MG/5ML suspension Take 30 mLs by mouth every 4 (four) hours as needed for indigestion or heartburn. Qty: 355 mL, Refills: 0    diltiazem (CARDIZEM CD) 180 MG 24 hr capsule Take 1 capsule (180 mg total) by mouth daily. Qty: 30 capsule, Refills: 0    metoprolol tartrate (LOPRESSOR) 25 MG tablet Take 0.5 tablets (12.5 mg total) by mouth 2 (two) times daily. Qty: 60 tablet, Refills: 0    Oyster Shell (OYSTER  CALCIUM) 500 MG TABS tablet Take 500 mg of elemental calcium by mouth 2 (two) times daily.    potassium chloride SA (K-DUR,KLOR-CON) 20 MEQ tablet Take 20 mEq by mouth daily.    prednisoLONE acetate (PRED FORTE) 1 % ophthalmic suspension Place 1 drop into both eyes 4 (four) times daily.    tiotropium (SPIRIVA) 18 MCG inhalation capsule Place 18 mcg into inhaler and inhale daily as needed (shortness of breath, wheezing).      STOP taking these medications     HYDROcodone-acetaminophen (VICODIN) 5-500 MG per tablet        Allergies  Allergen Reactions  . Shrimp [Shellfish Allergy] Swelling      The results of significant diagnostics from this hospitalization (including imaging, microbiology, ancillary and laboratory) are listed below for reference.    Significant Diagnostic Studies: Dg Chest 2 View  09/30/2014   CLINICAL DATA:  81 year old female with syncope today.  EXAM: CHEST  2 VIEW  COMPARISON:  04/14/2014 and prior radiographs  FINDINGS: Cardiomegaly is again identified.  Mild interstitial prominence is unchanged.  There is no evidence of focal airspace disease, pulmonary edema, suspicious pulmonary nodule/mass,  pleural effusion, or pneumothorax.  No acute bony abnormalities are identified.  IMPRESSION: Cardiomegaly without evidence of acute cardiopulmonary disease.   Electronically Signed   By: Harmon Pier M.D.   On: 09/30/2014 14:36   Ct Head Wo Contrast  09/30/2014   CLINICAL DATA:  Patient found down after walking multiple blocks. Confused. No reported loss of consciousness.  EXAM: CT HEAD WITHOUT CONTRAST  TECHNIQUE: Contiguous axial images were obtained from the base of the skull through the vertex without intravenous contrast.  COMPARISON:  Brain CT 08/30/2011; MR brain 08/30/2011  FINDINGS: Left occipital lobe encephalomalacia with ex vacuo dilatation of the left lateral ventricle. Cortical atrophy. Chronic small vessel ischemic changes. Right basal ganglia lacunar infarct.  No evidence for acute cortically based infarct, intracranial hemorrhage, mass lesion or mass-effect. The orbits are unremarkable. The paranasal sinuses are unremarkable. The mastoid air cells are well aerated. Calvarium is intact.  IMPRESSION: Age-indeterminate but likely chronic left occipital lobe infarct.  Chronic small vessel ischemic changes and cortical atrophy.  No acute intracranial process.   Electronically Signed   By: Annia Belt M.D.   On: 09/30/2014 13:59   Mr Shirlee Latch Wo Contrast  09/30/2014   CLINICAL DATA:  81 year old female found with right facial droop, dysarthria, right side weakness. Initial encounter.  EXAM: MRI HEAD WITHOUT CONTRAST  MRA HEAD WITHOUT CONTRAST  TECHNIQUE: Multiplanar, multiecho pulse sequences of the brain and surrounding structures were obtained without intravenous contrast. Angiographic images of the head were obtained using MRA technique without contrast.  COMPARISON:  Head CT without contrast 1347 hr today. Brain MRI 08/30/2011.  FINDINGS: MRI HEAD FINDINGS  Major intracranial vascular flow voids are stable. No restricted diffusion to suggest acute infarction. No midline shift, mass effect, evidence of mass lesion, ventriculomegaly, extra-axial collection or acute intracranial hemorrhage. Cervicomedullary junction and pituitary are within normal limits.  Cerebral volume is not significantly changed since 2013. Patchy and confluent cerebral white matter T2 and FLAIR hyperintensity. There is now chronic encephalomalacia in the left occipital pole (series 10, image 6). Associated hemosiderin. T2 heterogeneity in the deep gray matter nuclei is stable. Chronic micro hemorrhage in the cerebellar hemispheres has progressed since 2013. No other cortical encephalomalacia.  Negative visualized cervical spine. Bone marrow signal is stable and within normal limits. Visible internal auditory structures appear normal. Paranasal sinuses and mastoids are clear. Negative orbit and scalp  soft tissues.  MRA HEAD FINDINGS  Study is mildly degraded by motion artifact despite repeated imaging attempts.  Dominant distal right vertebral artery. Antegrade flow in the posterior circulation. The left vertebral appears to functionally terminate in PICA. No basilar artery stenosis. SCA and left PCA origin are normal. Fetal type right PCA origin. Negative right PCA branches. Moderate irregularity and tandem stenosis in the left PCA P2 segment with preserved distal flow. Left posterior communicating artery is diminutive or absent.  Antegrade flow in both ICA siphons. Mild to moderate siphon irregularity but no hemodynamically significant stenosis. Ophthalmic and right posterior communicating artery origins are within normal limits. Patent carotid termini. MCA origins, M1 segments, and MCA bifurcations are within normal limits. No major MCA branch occlusion. Mild to moderate bilateral MCA branch irregularity.  Mild to moderate bilateral A1 ACA segment irregularity and stenosis. Diminutive or absent anterior communicating artery. Moderate to severe bilateral A2 segment stenosis with preserved distal flow.  IMPRESSION: 1.  No acute intracranial abnormality. 2. Chronic left PCA infarct and bilateral cerebellar micro hemorrhage have progressed since 2013. Chronic  small vessel disease. 3. Intracranial atherosclerosis but no major circle of Willis branch occlusion. Moderate to severe stenoses of the bilateral ACA A2 segments and left PCA P2 segment.   Electronically Signed   By: Odessa Fleming M.D.   On: 09/30/2014 18:46   Mr Brain Wo Contrast  09/30/2014   CLINICAL DATA:  81 year old female found with right facial droop, dysarthria, right side weakness. Initial encounter.  EXAM: MRI HEAD WITHOUT CONTRAST  MRA HEAD WITHOUT CONTRAST  TECHNIQUE: Multiplanar, multiecho pulse sequences of the brain and surrounding structures were obtained without intravenous contrast. Angiographic images of the head were obtained using MRA  technique without contrast.  COMPARISON:  Head CT without contrast 1347 hr today. Brain MRI 08/30/2011.  FINDINGS: MRI HEAD FINDINGS  Major intracranial vascular flow voids are stable. No restricted diffusion to suggest acute infarction. No midline shift, mass effect, evidence of mass lesion, ventriculomegaly, extra-axial collection or acute intracranial hemorrhage. Cervicomedullary junction and pituitary are within normal limits.  Cerebral volume is not significantly changed since 2013. Patchy and confluent cerebral white matter T2 and FLAIR hyperintensity. There is now chronic encephalomalacia in the left occipital pole (series 10, image 6). Associated hemosiderin. T2 heterogeneity in the deep gray matter nuclei is stable. Chronic micro hemorrhage in the cerebellar hemispheres has progressed since 2013. No other cortical encephalomalacia.  Negative visualized cervical spine. Bone marrow signal is stable and within normal limits. Visible internal auditory structures appear normal. Paranasal sinuses and mastoids are clear. Negative orbit and scalp soft tissues.  MRA HEAD FINDINGS  Study is mildly degraded by motion artifact despite repeated imaging attempts.  Dominant distal right vertebral artery. Antegrade flow in the posterior circulation. The left vertebral appears to functionally terminate in PICA. No basilar artery stenosis. SCA and left PCA origin are normal. Fetal type right PCA origin. Negative right PCA branches. Moderate irregularity and tandem stenosis in the left PCA P2 segment with preserved distal flow. Left posterior communicating artery is diminutive or absent.  Antegrade flow in both ICA siphons. Mild to moderate siphon irregularity but no hemodynamically significant stenosis. Ophthalmic and right posterior communicating artery origins are within normal limits. Patent carotid termini. MCA origins, M1 segments, and MCA bifurcations are within normal limits. No major MCA branch occlusion. Mild to  moderate bilateral MCA branch irregularity.  Mild to moderate bilateral A1 ACA segment irregularity and stenosis. Diminutive or absent anterior communicating artery. Moderate to severe bilateral A2 segment stenosis with preserved distal flow.  IMPRESSION: 1.  No acute intracranial abnormality. 2. Chronic left PCA infarct and bilateral cerebellar micro hemorrhage have progressed since 2013. Chronic small vessel disease. 3. Intracranial atherosclerosis but no major circle of Willis branch occlusion. Moderate to severe stenoses of the bilateral ACA A2 segments and left PCA P2 segment.   Electronically Signed   By: Odessa Fleming M.D.   On: 09/30/2014 18:46    Microbiology: Recent Results (from the past 240 hour(s))  Urine culture     Status: None   Collection Time: 09/30/14  4:23 PM  Result Value Ref Range Status   Specimen Description URINE, CLEAN CATCH  Final   Special Requests NONE  Final   Culture MULTIPLE SPECIES PRESENT, SUGGEST RECOLLECTION  Final   Report Status 10/01/2014 FINAL  Final     Labs: Basic Metabolic Panel:  Recent Labs Lab 09/30/14 1337 09/30/14 1347 10/02/14 0414  NA 138 139 138  K 4.4 4.1 3.9  CL 103 102 103  CO2 25  --  28  GLUCOSE 120* 120* 109*  BUN 26* 32* 18  CREATININE 1.58* 1.40* 1.24*  CALCIUM 9.8  --  8.9   Liver Function Tests:  Recent Labs Lab 09/30/14 1337  AST 29  ALT 16  ALKPHOS 66  BILITOT 1.6*  PROT 6.3*  ALBUMIN 3.0*   No results for input(s): LIPASE, AMYLASE in the last 168 hours. No results for input(s): AMMONIA in the last 168 hours. CBC:  Recent Labs Lab 09/30/14 1337 09/30/14 1347 10/02/14 0414  WBC 15.6*  --  11.9*  NEUTROABS 12.3*  --   --   HGB 13.7 15.0 12.7  HCT 41.7 44.0 39.8  MCV 87.2  --  89.2  PLT 241  --  241   Cardiac Enzymes:  Recent Labs Lab 09/30/14 1900  CKTOTAL 114   BNP: BNP (last 3 results) No results for input(s): BNP in the last 8760 hours.  ProBNP (last 3 results) No results for input(s):  PROBNP in the last 8760 hours.  CBG: No results for input(s): GLUCAP in the last 168 hours.     SignedMarlin Canary  [facility] 10/02/2014, 1:10 PM

## 2014-10-02 NOTE — Progress Notes (Signed)
ANTICOAGULATION CONSULT NOTE - Initial Consult  Pharmacy Consult for Apixaban Indication: atrial fibrillation  Allergies  Allergen Reactions  . Shrimp [Shellfish Allergy] Swelling    Patient Measurements: Height:  (165.1 cm) Weight: 151 lb (68.493 kg) IBW/kg (Calculated) : 57 Heparin Dosing Weight:   Vital Signs: Temp: 97.9 F (36.6 C) (08/27 0936) Temp Source: Oral (08/27 0936) BP: 122/65 mmHg (08/27 0936) Pulse Rate: 86 (08/27 0613)  Labs:  Recent Labs  09/30/14 1337 09/30/14 1347 09/30/14 1900 10/02/14 0414  HGB 13.7 15.0  --  12.7  HCT 41.7 44.0  --  39.8  PLT 241  --   --  241  APTT 37  --   --   --   LABPROT 20.7*  --   --   --   INR 1.78*  --   --   --   CREATININE 1.58* 1.40*  --  1.24*  CKTOTAL  --   --  114  --     Estimated Creatinine Clearance: 27 mL/min (by C-G formula based on Cr of 1.24).   Medical History: Past Medical History  Diagnosis Date  . Hypertension   . Chronic atrial fibrillation   . Arthritis   . Chronic combined systolic and diastolic CHF (congestive heart failure)     a. Dx 2013, EF 45-50%, felt due in part to rapid AF.  Marland Kitchen Asthma   . Hypokalemia   . TIA (transient ischemic attack)     a. Question TIA during 2013 admission with word finding difficulty, already on Coumadin at the time.  . CKD (chronic kidney disease), stage III     a. Per review of chart, likely CKD stage III in 2013..  . Stroke     Medications:  Prescriptions prior to admission  Medication Sig Dispense Refill Last Dose  . acetaminophen (TYLENOL) 325 MG tablet Take 2 tablets (650 mg total) by mouth every 6 (six) hours as needed for headache. 30 tablet 0 Past Month at Unknown time  . alum & mag hydroxide-simeth (MAALOX/MYLANTA) 200-200-20 MG/5ML suspension Take 30 mLs by mouth every 4 (four) hours as needed for indigestion or heartburn. 355 mL 0 Past Month at Unknown time  . apixaban (ELIQUIS) 2.5 MG TABS tablet Take 2.5 mg by mouth 2 (two) times daily.    09/30/2014 at 8a  . diltiazem (CARDIZEM CD) 180 MG 24 hr capsule Take 1 capsule (180 mg total) by mouth daily. 30 capsule 0 09/30/2014 at Unknown time  . furosemide (LASIX) 20 MG tablet Take 20 mg by mouth daily.   09/30/2014 at Unknown time  . metoprolol tartrate (LOPRESSOR) 25 MG tablet Take 0.5 tablets (12.5 mg total) by mouth 2 (two) times daily. 60 tablet 0 09/30/2014 at 8a  . Oyster Shell (OYSTER CALCIUM) 500 MG TABS tablet Take 500 mg of elemental calcium by mouth 2 (two) times daily.   09/30/2014 at Unknown time  . potassium chloride SA (K-DUR,KLOR-CON) 20 MEQ tablet Take 20 mEq by mouth daily.   09/30/2014 at Unknown time  . prednisoLONE acetate (PRED FORTE) 1 % ophthalmic suspension Place 1 drop into both eyes 4 (four) times daily.   09/30/2014 at Unknown time  . tiotropium (SPIRIVA) 18 MCG inhalation capsule Place 18 mcg into inhaler and inhale daily as needed (shortness of breath, wheezing).   Past Month at Unknown time  . HYDROcodone-acetaminophen (VICODIN) 5-500 MG per tablet Take 1 tablet by mouth every 6 (six) hours as needed for pain. 30 tablet 0 Taking  Scheduled:  . apixaban  2.5 mg Oral BID  . atorvastatin  10 mg Oral q1800  . cephALEXin  250 mg Oral Q12H  . diltiazem  180 mg Oral Daily  . metoprolol tartrate  12.5 mg Oral BID  . prednisoLONE acetate  1 drop Both Eyes QID   Infusions:    Assessment: 81yo female with history of Afib on apixaban presents R arm weakness and R facial droop. Pharmacy is consulted to dose apixaban for atrial fibrillation. CBC wnl, sCr 1.24. Pt was on apixaban 2.5mg  BID PTA, however, does qualify for regular dose based on sCr and weight > 60kg. Pt received 2.5mg  dose this morning so will give additional 2.5mg  for total morning dose of 5mg .  Goal of Therapy:  Monitor platelets by anticoagulation protocol: Yes   Plan:  Apixaban 5mg  PO BID Daily CBC Monitor s/sx of bleeding  Arlean Hopping. Newman Pies, PharmD Clinical Pharmacist Pager  [phone] 10/02/2014,10:51 AM

## 2014-10-02 NOTE — Social Work (Signed)
Facility [facility]) is ready to receive patient. RN can call report to [phone]. Family states that son will provide transport at discharge.  CSW will fax d/c summary once complete.  Beverly Sessions MSW, LCSW [phone]

## 2014-10-05 NOTE — Progress Notes (Signed)
OT Note - Addendum    10/01/14 1100  OT Visit Information  Last OT Received On 27-Oct-2014  OT G-codes **NOT FOR INPATIENT CLASS**  Functional Assessment Tool Used clinical judgement  Functional Limitation Self care  Self Care Current Status [phone]) CK  Self Care Goal Status [phone]) CI  [facility], OTR/L  [phone] 10/01/2014

## 2014-10-14 ENCOUNTER — Ambulatory Visit: Payer: Medicare Other | Admitting: Cardiology

## 2014-10-14 NOTE — Progress Notes (Signed)
   10/01/14 1358  PT G-Codes **NOT FOR INPATIENT CLASS**  Functional Assessment Tool Used assist level  Functional Limitation Mobility: Walking and moving around  Mobility: Walking and Moving Around Current Status [phone]) CK  Mobility: Walking and Moving Around Goal Status [phone]) CI  2014/11/03 late entry g-codes Romualdo Prosise B. Elianis Fischbach, PT, DPT [phone]

## 2014-10-20 ENCOUNTER — Encounter: Payer: Self-pay | Admitting: Cardiology

## 2014-10-26 ENCOUNTER — Emergency Department (HOSPITAL_COMMUNITY): Payer: Medicare Other

## 2014-10-26 ENCOUNTER — Observation Stay (HOSPITAL_COMMUNITY)
Admission: EM | Admit: 2014-10-26 | Discharge: 2014-10-28 | Disposition: A | Discharge status: Skilled Nursing Facility | Payer: Medicare Other | Attending: Internal Medicine | Admitting: Internal Medicine

## 2014-10-26 ENCOUNTER — Encounter (HOSPITAL_COMMUNITY): Payer: Self-pay | Admitting: Emergency Medicine

## 2014-10-26 DIAGNOSIS — I5042 Chronic combined systolic (congestive) and diastolic (congestive) heart failure: Secondary | ICD-10-CM | POA: Diagnosis not present

## 2014-10-26 DIAGNOSIS — I272 Other secondary pulmonary hypertension: Secondary | ICD-10-CM | POA: Diagnosis not present

## 2014-10-26 DIAGNOSIS — G9340 Encephalopathy, unspecified: Secondary | ICD-10-CM | POA: Diagnosis not present

## 2014-10-26 DIAGNOSIS — Z66 Do not resuscitate: Secondary | ICD-10-CM | POA: Insufficient documentation

## 2014-10-26 DIAGNOSIS — N189 Chronic kidney disease, unspecified: Secondary | ICD-10-CM | POA: Insufficient documentation

## 2014-10-26 DIAGNOSIS — R7989 Other specified abnormal findings of blood chemistry: Secondary | ICD-10-CM | POA: Insufficient documentation

## 2014-10-26 DIAGNOSIS — I129 Hypertensive chronic kidney disease with stage 1 through stage 4 chronic kidney disease, or unspecified chronic kidney disease: Secondary | ICD-10-CM | POA: Insufficient documentation

## 2014-10-26 DIAGNOSIS — Z7901 Long term (current) use of anticoagulants: Secondary | ICD-10-CM | POA: Insufficient documentation

## 2014-10-26 DIAGNOSIS — I482 Chronic atrial fibrillation, unspecified: Secondary | ICD-10-CM | POA: Diagnosis present

## 2014-10-26 DIAGNOSIS — N183 Chronic kidney disease, stage 3 (moderate): Secondary | ICD-10-CM | POA: Diagnosis not present

## 2014-10-26 DIAGNOSIS — M1990 Unspecified osteoarthritis, unspecified site: Secondary | ICD-10-CM | POA: Diagnosis present

## 2014-10-26 DIAGNOSIS — I350 Nonrheumatic aortic (valve) stenosis: Secondary | ICD-10-CM | POA: Insufficient documentation

## 2014-10-26 DIAGNOSIS — Z8673 Personal history of transient ischemic attack (TIA), and cerebral infarction without residual deficits: Secondary | ICD-10-CM | POA: Insufficient documentation

## 2014-10-26 DIAGNOSIS — I10 Essential (primary) hypertension: Secondary | ICD-10-CM | POA: Diagnosis present

## 2014-10-26 DIAGNOSIS — Z96643 Presence of artificial hip joint, bilateral: Secondary | ICD-10-CM | POA: Diagnosis not present

## 2014-10-26 DIAGNOSIS — J9601 Acute respiratory failure with hypoxia: Principal | ICD-10-CM | POA: Insufficient documentation

## 2014-10-26 DIAGNOSIS — J45901 Unspecified asthma with (acute) exacerbation: Secondary | ICD-10-CM | POA: Diagnosis present

## 2014-10-26 DIAGNOSIS — R778 Other specified abnormalities of plasma proteins: Secondary | ICD-10-CM | POA: Insufficient documentation

## 2014-10-26 LAB — I-STAT ARTERIAL BLOOD GAS, ED
ACID-BASE EXCESS: 3 mmol/L — AB (ref 0.0–2.0)
Bicarbonate: 30.1 mEq/L — ABNORMAL HIGH (ref 20.0–24.0)
O2 SAT: 95 %
PH ART: 7.34 — AB (ref 7.350–7.450)
PO2 ART: 84 mmHg (ref 80.0–100.0)
TCO2: 32 mmol/L (ref 0–100)
pCO2 arterial: 55.9 mmHg — ABNORMAL HIGH (ref 35.0–45.0)

## 2014-10-26 LAB — CBC WITH DIFFERENTIAL/PLATELET
BASOS ABS: 0 10*3/uL (ref 0.0–0.1)
Basophils Relative: 0 %
EOS ABS: 0.1 10*3/uL (ref 0.0–0.7)
EOS PCT: 1 %
HCT: 42 % (ref 36.0–46.0)
Hemoglobin: 13.1 g/dL (ref 12.0–15.0)
LYMPHS PCT: 18 %
Lymphs Abs: 2.5 10*3/uL (ref 0.7–4.0)
MCH: 27.8 pg (ref 26.0–34.0)
MCHC: 31.2 g/dL (ref 30.0–36.0)
MCV: 89 fL (ref 78.0–100.0)
MONO ABS: 1.4 10*3/uL — AB (ref 0.1–1.0)
Monocytes Relative: 10 %
Neutro Abs: 10.1 10*3/uL — ABNORMAL HIGH (ref 1.7–7.7)
Neutrophils Relative %: 71 %
PLATELETS: 266 10*3/uL (ref 150–400)
RBC: 4.72 MIL/uL (ref 3.87–5.11)
RDW: 14 % (ref 11.5–15.5)
WBC: 14.1 10*3/uL — AB (ref 4.0–10.5)

## 2014-10-26 LAB — COMPREHENSIVE METABOLIC PANEL
ALT: 9 U/L — ABNORMAL LOW (ref 14–54)
AST: 20 U/L (ref 15–41)
Albumin: 3 g/dL — ABNORMAL LOW (ref 3.5–5.0)
Alkaline Phosphatase: 79 U/L (ref 38–126)
Anion gap: 11 (ref 5–15)
BUN: 15 mg/dL (ref 6–20)
CHLORIDE: 100 mmol/L — AB (ref 101–111)
CO2: 28 mmol/L (ref 22–32)
Calcium: 10 mg/dL (ref 8.9–10.3)
Creatinine, Ser: 1.2 mg/dL — ABNORMAL HIGH (ref 0.44–1.00)
GFR, EST AFRICAN AMERICAN: 43 mL/min — AB (ref >60)
GFR, EST NON AFRICAN AMERICAN: 37 mL/min — AB (ref >60)
Glucose, Bld: 125 mg/dL — ABNORMAL HIGH (ref 65–99)
POTASSIUM: 3.6 mmol/L (ref 3.5–5.1)
SODIUM: 139 mmol/L (ref 135–145)
Total Bilirubin: 0.8 mg/dL (ref 0.3–1.2)
Total Protein: 7.6 g/dL (ref 6.5–8.1)

## 2014-10-26 LAB — TROPONIN I: TROPONIN I: 0.06 ng/mL — AB (ref <0.031)

## 2014-10-26 LAB — BRAIN NATRIURETIC PEPTIDE: B NATRIURETIC PEPTIDE 5: 342.6 pg/mL — AB (ref 0.0–100.0)

## 2014-10-26 LAB — PROTIME-INR
INR: 2.36 — AB (ref 0.00–1.49)
PROTHROMBIN TIME: 25.6 s — AB (ref 11.6–15.2)

## 2014-10-26 MED ORDER — METHYLPREDNISOLONE SODIUM SUCC 125 MG IJ SOLR
80.0000 mg | Freq: Once | INTRAMUSCULAR | Status: AC
  Administered 2014-10-26: 80 mg via INTRAVENOUS
  Filled 2014-10-26: qty 2

## 2014-10-26 MED ORDER — IPRATROPIUM-ALBUTEROL 0.5-2.5 (3) MG/3ML IN SOLN
3.0000 mL | Freq: Once | RESPIRATORY_TRACT | Status: AC
  Administered 2014-10-26: 3 mL via RESPIRATORY_TRACT
  Filled 2014-10-26: qty 3

## 2014-10-26 MED ORDER — MAGNESIUM SULFATE 2 GM/50ML IV SOLN
2.0000 g | Freq: Once | INTRAVENOUS | Status: AC
  Administered 2014-10-26: 2 g via INTRAVENOUS
  Filled 2014-10-26: qty 50

## 2014-10-26 NOTE — ED Notes (Signed)
Pt comes from [facility] facility c/o cough x 3 days and SOB x 2 days.  Pt has hx asthma (and prescription for Spiriva, but facility is unable to give it to her), CHF, A-Fib.  Pt BP initially 200/100 upon EMS arrival - down to 116/78 after total  Albuterol neb and 0.5mg  Atrovent.  Pt also c/o chills before EMS arrived and facility reports temp 99.5.  Pt denies CP, pain, N/V/D. HR 90s-100s A-Fib.  20g. LAC.  Pt A&O x 4.

## 2014-10-26 NOTE — ED Notes (Addendum)
Due to patient safety, unable to arouse pt for ambulation at this time. MD made aware.

## 2014-10-26 NOTE — ED Provider Notes (Signed)
CSN: [id number]     Arrival date & time 10/26/14  1906 History   First MD Initiated Contact with Patient 10/26/14 1917     Chief Complaint  Patient presents with  . Shortness of Breath     (Consider location/radiation/quality/duration/timing/severity/associated sxs/prior Treatment) HPI Comments: Patient from assisted living facility with three-day history of cough and shortness of breath and wheezing. History of asthma but no regular scheduled medications for this. Found to be wheezing by EMS and given albuterol and Atrovent. Also had chills but no fever. No chest pain, nausea, vomiting or diarrhea. No documented fever. atrial fibrillation with history of same on eliquis. Denies any leg pain or leg swelling.  The history is provided by the patient and the EMS personnel. The history is limited by the condition of the patient.    Past Medical History  Diagnosis Date  . Hypertension   . Chronic atrial fibrillation   . Arthritis   . Chronic combined systolic and diastolic CHF (congestive heart failure)     a. Dx 2013, EF 45-50%, felt due in part to rapid AF.  Marland Kitchen Asthma   . Hypokalemia   . TIA (transient ischemic attack)     a. Question TIA during 2013 admission with word finding difficulty, already on Coumadin at the time.  . CKD (chronic kidney disease), stage III     a. Per review of chart, likely CKD stage III in 2013..  . Stroke    Past Surgical History  Procedure Laterality Date  . Other surgical history      bilateral hip replacement  . Joint replacement      bilateral hip replacement   Family History  Problem Relation Age of Onset  . Heart failure Mother   . Heart attack Sister   . Heart attack Brother   . Stroke Mother   . Stroke Maternal Aunt   . Hypertension Mother   . Hypertension Sister   . Hypertension Brother    Social History  Substance Use Topics  . Smoking status: Never Smoker   . Smokeless tobacco: None  . Alcohol Use: No   OB History    No data  available     Review of Systems  Constitutional: Positive for chills. Negative for fever, activity change and appetite change.  HENT: Negative for congestion and rhinorrhea.   Respiratory: Positive for cough, shortness of breath and wheezing. Negative for chest tightness.   Cardiovascular: Negative for chest pain and leg swelling.  Gastrointestinal: Negative for nausea, vomiting and abdominal pain.  Genitourinary: Negative for dysuria, hematuria, vaginal bleeding and vaginal discharge.  Musculoskeletal: Negative for myalgias and arthralgias.  Skin: Negative for rash.  Neurological: Negative for dizziness, weakness and headaches.  A complete 10 system review of systems was obtained and all systems are negative except as noted in the HPI and PMH.      Allergies  Shrimp  Home Medications   Prior to Admission medications   Medication Sig Start Date End Date Taking? Authorizing Provider  acetaminophen (TYLENOL) 325 MG tablet Take 2 tablets (650 mg total) by mouth every 6 (six) hours as needed for headache. 04/16/14  Yes Alison Murray, MD  alum & mag hydroxide-simeth (MAALOX/MYLANTA) 200-200-20 MG/5ML suspension Take 30 mLs by mouth every 4 (four) hours as needed for indigestion or heartburn. 04/16/14  Yes Alison Murray, MD  apixaban (ELIQUIS) 5 MG TABS tablet Take 1 tablet (5 mg total) by mouth 2 (two) times daily. 10/02/14  Yes Shanda Bumps  U Vann, DO  diltiazem (CARDIZEM CD) 180 MG 24 hr capsule Take 1 capsule (180 mg total) by mouth daily. 04/16/14  Yes Alison Murray, MD  furosemide (LASIX) 20 MG tablet Take 1 tablet (20 mg total) by mouth every other day. 10/02/14  Yes Joseph Art, DO  metoprolol tartrate (LOPRESSOR) 25 MG tablet Take 0.5 tablets (12.5 mg total) by mouth 2 (two) times daily. 04/16/14  Yes Alison Murray, MD  Oyster Shell (OYSTER CALCIUM) 500 MG TABS tablet Take 500 mg of elemental calcium by mouth 2 (two) times daily.   Yes Historical Provider, MD  potassium chloride SA  (K-DUR,KLOR-CON) 20 MEQ tablet Take 20 mEq by mouth daily.   Yes Historical Provider, MD  pravastatin (PRAVACHOL) 10 MG tablet Take 10 mg by mouth every evening.   Yes Historical Provider, MD  prednisoLONE acetate (PRED FORTE) 1 % ophthalmic suspension Place 1 drop into both eyes 4 (four) times daily.   Yes Historical Provider, MD  tiotropium (SPIRIVA) 18 MCG inhalation capsule Place 18 mcg into inhaler and inhale daily as needed (shortness of breath, wheezing).   Yes Historical Provider, MD  atorvastatin (LIPITOR) 10 MG tablet Take 1 tablet (10 mg total) by mouth daily at 6 PM. Patient not taking: Reported on 10/26/2014 10/02/14   Shanda Bumps U Vann, DO   BP 107/53 mmHg  Pulse 94  Temp(Src) 98.6 F (37 C) (Oral)  Resp 22  Ht  (1.6 m)  Wt 151 lb (68.493 kg)  BMI 26.76 kg/m2  SpO2 99% Physical Exam  Constitutional: She is oriented to person, place, and time. She appears well-developed and well-nourished. No distress.  HENT:  Head: Normocephalic and atraumatic.  Mouth/Throat: Oropharynx is clear and moist. No oropharyngeal exudate.  Eyes: Conjunctivae and EOM are normal. Pupils are equal, round, and reactive to light.  Neck: Normal range of motion. Neck supple.  No meningismus.  Cardiovascular: Normal rate, normal heart sounds and intact distal pulses.   No murmur heard. Irregular rhythm  Pulmonary/Chest: Effort normal. No respiratory distress. She has wheezes. She exhibits no tenderness.  Scattered expiratory wheezing bilaterally with fair air exchange  Abdominal: Soft. There is no tenderness. There is no rebound and no guarding.  Musculoskeletal: Normal range of motion. She exhibits no edema or tenderness.  Neurological: She is alert and oriented to person, place, and time. No cranial nerve deficit. She exhibits normal muscle tone. Coordination normal.  No ataxia on finger to nose bilaterally. No pronator drift. 5/5 strength throughout. CN 2-12 intact.Equal grip strength. Sensation  intact.   Skin: Skin is warm.  Psychiatric: She has a normal mood and affect. Her behavior is normal.  Nursing note and vitals reviewed.   ED Course  Procedures (including critical care time) Labs Review Labs Reviewed  CBC WITH DIFFERENTIAL/PLATELET - Abnormal; Notable for the following:    WBC 14.1 (*)    Neutro Abs 10.1 (*)    Monocytes Absolute 1.4 (*)    All other components within normal limits  COMPREHENSIVE METABOLIC PANEL - Abnormal; Notable for the following:    Chloride 100 (*)    Glucose, Bld 125 (*)    Creatinine, Ser 1.20 (*)    Albumin 3.0 (*)    ALT 9 (*)    GFR calc non Af Amer 37 (*)    GFR calc Af Amer 43 (*)    All other components within normal limits  BRAIN NATRIURETIC PEPTIDE - Abnormal; Notable for the following:  B Natriuretic Peptide 342.6 (*)    All other components within normal limits  TROPONIN I - Abnormal; Notable for the following:    Troponin I 0.06 (*)    All other components within normal limits  PROTIME-INR - Abnormal; Notable for the following:    Prothrombin Time 25.6 (*)    INR 2.36 (*)    All other components within normal limits  I-STAT ARTERIAL BLOOD GAS, ED - Abnormal; Notable for the following:    pH, Arterial 7.340 (*)    pCO2 arterial 55.9 (*)    Bicarbonate 30.1 (*)    Acid-Base Excess 3.0 (*)    All other components within normal limits  URINALYSIS, ROUTINE W REFLEX MICROSCOPIC (NOT AT [facility])    Imaging Review Dg Chest 2 View  10/26/2014   CLINICAL DATA:  Shortness of breath. Cough for 3 days. History of asthma. Patient motion.  EXAM: CHEST  2 VIEW  COMPARISON:  09/30/2014  FINDINGS: Heart is accentuated by technique and mildly enlarged. There are no focal consolidations. No pleural effusions or pulmonary edema.  IMPRESSION: Cardiomegaly.  No evidence for acute pulmonary abnormality.   Electronically Signed   By: Norva Pavlov M.D.   On: 10/26/2014 20:44   I have personally reviewed and evaluated these images and lab  results as part of my medical decision-making.   EKG Interpretation   Date/Time:  Tuesday October 26 2014 19:13:02 EDT Ventricular Rate:  98 PR Interval:    QRS Duration: 86 QT Interval:  370 QTC Calculation: 472 R Axis:   80 Text Interpretation:  Atrial fibrillation Ventricular premature complex  Borderline repol abnormality, diffuse leads No significant change was  found Confirmed by Manus Gunning  MD, STEPHEN [id number]) on 10/26/2014 7:21:18 PM      MDM   Final diagnoses:  Asthma exacerbation   Shortness of breath with cough and wheezing. No hypoxia. Atrial fibrillation with rate controlled.  Nebulizers given in route. We'll give solu- Medrol. Check labs and chest x-ray.  Patient with increasing confusion and somnolence in the ED.  ABG ordered and CT head ordered. No focal neuro deficits.  Minimal troponin elevation as previous.  Granddaughter at bedside states patient is always confused after she falls asleep. Patient is arousable and is oriented and denies pain. She has minimal CO2 retention on ABG.  Granddaughter decline CT head.  Patient with new oxygen requirement, difficulty breathing and continued wheezing. She is given additional nebulizers and magnesium. Admission for asthma exacerbation discussed with Dr. Robb Matar.    Glynn Octave, MD 10/27/14 [phone]

## 2014-10-26 NOTE — ED Notes (Signed)
MD at bedside. 

## 2014-10-26 NOTE — ED Notes (Signed)
MD to see and assess pt before RN assessment. 

## 2014-10-26 NOTE — ED Notes (Addendum)
Pt placed on bedpan.  This RN went back to check on pt and pt seems more lethargic and hard to arouse than before.  Pt states she is just sleepy, denies new weakness or pain.  MD notified and assessed pt.

## 2014-10-26 NOTE — ED Notes (Signed)
Pt returned from X Ray.

## 2014-10-26 NOTE — ED Notes (Signed)
Attempted report x1. 

## 2014-10-27 ENCOUNTER — Encounter (HOSPITAL_COMMUNITY): Payer: Self-pay | Admitting: Internal Medicine

## 2014-10-27 DIAGNOSIS — J45901 Unspecified asthma with (acute) exacerbation: Secondary | ICD-10-CM | POA: Diagnosis not present

## 2014-10-27 LAB — URINALYSIS, ROUTINE W REFLEX MICROSCOPIC
BILIRUBIN URINE: NEGATIVE
GLUCOSE, UA: NEGATIVE mg/dL
HGB URINE DIPSTICK: NEGATIVE
KETONES UR: 15 mg/dL — AB
Leukocytes, UA: NEGATIVE
Nitrite: NEGATIVE
PROTEIN: 100 mg/dL — AB
Specific Gravity, Urine: 1.018 (ref 1.005–1.030)
UROBILINOGEN UA: 0.2 mg/dL (ref 0.0–1.0)
pH: 5 (ref 5.0–8.0)

## 2014-10-27 LAB — COMPREHENSIVE METABOLIC PANEL
ALBUMIN: 2.8 g/dL — AB (ref 3.5–5.0)
ALK PHOS: 75 U/L (ref 38–126)
ALT: 10 U/L — ABNORMAL LOW (ref 14–54)
ANION GAP: 14 (ref 5–15)
AST: 25 U/L (ref 15–41)
BUN: 19 mg/dL (ref 6–20)
CHLORIDE: 99 mmol/L — AB (ref 101–111)
CO2: 26 mmol/L (ref 22–32)
Calcium: 10 mg/dL (ref 8.9–10.3)
Creatinine, Ser: 1.52 mg/dL — ABNORMAL HIGH (ref 0.44–1.00)
GFR calc non Af Amer: 28 mL/min — ABNORMAL LOW (ref >60)
GFR, EST AFRICAN AMERICAN: 33 mL/min — AB (ref >60)
GLUCOSE: 217 mg/dL — AB (ref 65–99)
POTASSIUM: 3.9 mmol/L (ref 3.5–5.1)
SODIUM: 139 mmol/L (ref 135–145)
Total Bilirubin: 0.5 mg/dL (ref 0.3–1.2)
Total Protein: 7 g/dL (ref 6.5–8.1)

## 2014-10-27 LAB — TROPONIN I
TROPONIN I: 0.07 ng/mL — AB (ref <0.031)
Troponin I: 0.08 ng/mL — ABNORMAL HIGH (ref <0.031)

## 2014-10-27 LAB — CBC
HCT: 41.6 % (ref 36.0–46.0)
HEMOGLOBIN: 12.8 g/dL (ref 12.0–15.0)
MCH: 27.8 pg (ref 26.0–34.0)
MCHC: 30.8 g/dL (ref 30.0–36.0)
MCV: 90.4 fL (ref 78.0–100.0)
PLATELETS: 280 10*3/uL (ref 150–400)
RBC: 4.6 MIL/uL (ref 3.87–5.11)
RDW: 14.2 % (ref 11.5–15.5)
WBC: 11.6 10*3/uL — ABNORMAL HIGH (ref 4.0–10.5)

## 2014-10-27 LAB — URINE MICROSCOPIC-ADD ON

## 2014-10-27 LAB — POTASSIUM: POTASSIUM: 4.5 mmol/L (ref 3.5–5.1)

## 2014-10-27 LAB — MRSA PCR SCREENING: MRSA BY PCR: NEGATIVE

## 2014-10-27 LAB — GLUCOSE, CAPILLARY: GLUCOSE-CAPILLARY: 170 mg/dL — AB (ref 65–99)

## 2014-10-27 MED ORDER — SODIUM CHLORIDE 0.9 % IJ SOLN
3.0000 mL | Freq: Two times a day (BID) | INTRAMUSCULAR | Status: DC
  Administered 2014-10-27 – 2014-10-28 (×4): 3 mL via INTRAVENOUS

## 2014-10-27 MED ORDER — METHYLPREDNISOLONE SODIUM SUCC 125 MG IJ SOLR
80.0000 mg | Freq: Two times a day (BID) | INTRAMUSCULAR | Status: DC
  Administered 2014-10-27: 80 mg via INTRAVENOUS
  Filled 2014-10-27: qty 2

## 2014-10-27 MED ORDER — APIXABAN 5 MG PO TABS
5.0000 mg | ORAL_TABLET | Freq: Two times a day (BID) | ORAL | Status: DC
  Administered 2014-10-27 – 2014-10-28 (×3): 5 mg via ORAL
  Filled 2014-10-27 (×3): qty 1

## 2014-10-27 MED ORDER — INFLUENZA VAC SPLIT QUAD 0.5 ML IM SUSY
0.5000 mL | PREFILLED_SYRINGE | INTRAMUSCULAR | Status: AC
  Administered 2014-10-28: 0.5 mL via INTRAMUSCULAR
  Filled 2014-10-27: qty 0.5

## 2014-10-27 MED ORDER — IPRATROPIUM-ALBUTEROL 0.5-2.5 (3) MG/3ML IN SOLN
3.0000 mL | Freq: Four times a day (QID) | RESPIRATORY_TRACT | Status: DC
  Administered 2014-10-27 – 2014-10-28 (×7): 3 mL via RESPIRATORY_TRACT
  Filled 2014-10-27 (×7): qty 3

## 2014-10-27 MED ORDER — TIOTROPIUM BROMIDE MONOHYDRATE 18 MCG IN CAPS: 18.0000 ug | ORAL_CAPSULE | Freq: Every day | RESPIRATORY_TRACT | Status: DC | PRN

## 2014-10-27 MED ORDER — CALCIUM CARBONATE 1250 (500 CA) MG PO TABS
500.0000 mg | ORAL_TABLET | Freq: Two times a day (BID) | ORAL | Status: DC
  Administered 2014-10-27 – 2014-10-28 (×3): 500 mg via ORAL
  Filled 2014-10-27 (×3): qty 1

## 2014-10-27 MED ORDER — ALBUTEROL SULFATE (2.5 MG/3ML) 0.083% IN NEBU: 2.5000 mg | INHALATION_SOLUTION | RESPIRATORY_TRACT | Status: DC | PRN

## 2014-10-27 MED ORDER — CALCIUM CARBONATE 1250 (500 CA) MG PO TABS: 500.0000 mg | ORAL_TABLET | Freq: Two times a day (BID) | ORAL | Status: DC

## 2014-10-27 MED ORDER — FUROSEMIDE 20 MG PO TABS
20.0000 mg | ORAL_TABLET | ORAL | Status: DC
  Administered 2014-10-27: 20 mg via ORAL
  Filled 2014-10-27: qty 1

## 2014-10-27 MED ORDER — DILTIAZEM HCL ER COATED BEADS 180 MG PO CP24
180.0000 mg | ORAL_CAPSULE | Freq: Every day | ORAL | Status: DC
  Administered 2014-10-27 – 2014-10-28 (×2): 180 mg via ORAL
  Filled 2014-10-27 (×2): qty 1

## 2014-10-27 MED ORDER — OYSTER CALCIUM 500 MG PO TABS: 500.0000 mg | ORAL_TABLET | Freq: Two times a day (BID) | ORAL | Status: DC

## 2014-10-27 MED ORDER — POTASSIUM CHLORIDE IN NACL 20-0.9 MEQ/L-% IV SOLN
INTRAVENOUS | Status: DC
  Administered 2014-10-27: 02:00:00 via INTRAVENOUS
  Filled 2014-10-27: qty 1000

## 2014-10-27 MED ORDER — ALUM & MAG HYDROXIDE-SIMETH 200-200-20 MG/5ML PO SUSP: 30.0000 mL | ORAL | Status: DC | PRN

## 2014-10-27 MED ORDER — ACETAMINOPHEN 325 MG PO TABS: 650.0000 mg | ORAL_TABLET | Freq: Four times a day (QID) | ORAL | Status: DC | PRN

## 2014-10-27 MED ORDER — METOPROLOL TARTRATE 12.5 MG HALF TABLET
12.5000 mg | ORAL_TABLET | Freq: Two times a day (BID) | ORAL | Status: DC
  Administered 2014-10-27 – 2014-10-28 (×3): 12.5 mg via ORAL
  Filled 2014-10-27 (×3): qty 1

## 2014-10-27 MED ORDER — PREDNISOLONE ACETATE 1 % OP SUSP
1.0000 [drp] | Freq: Four times a day (QID) | OPHTHALMIC | Status: DC
  Administered 2014-10-27 – 2014-10-28 (×6): 1 [drp] via OPHTHALMIC
  Filled 2014-10-27: qty 1

## 2014-10-27 MED ORDER — POTASSIUM CHLORIDE 10 MEQ/100ML IV SOLN
10.0000 meq | Freq: Once | INTRAVENOUS | Status: AC
  Administered 2014-10-27: 10 meq via INTRAVENOUS
  Filled 2014-10-27: qty 100

## 2014-10-27 MED ORDER — POTASSIUM CHLORIDE CRYS ER 20 MEQ PO TBCR
20.0000 meq | EXTENDED_RELEASE_TABLET | Freq: Every day | ORAL | Status: DC
  Administered 2014-10-27 – 2014-10-28 (×2): 20 meq via ORAL
  Filled 2014-10-27 (×2): qty 1

## 2014-10-27 MED ORDER — PREDNISONE 20 MG PO TABS
40.0000 mg | ORAL_TABLET | Freq: Every day | ORAL | Status: DC
  Administered 2014-10-28: 40 mg via ORAL
  Filled 2014-10-27: qty 2

## 2014-10-27 NOTE — Progress Notes (Signed)
Patient's PIV to LAC infiltrated with Potassium running.  Pharmacy, IV RN and MD on call notified.  IV d/c and warm compress applied to site.  New PIV inserted. Patient is currently resting comfortably. Will continue to monitor. Troy Sine

## 2014-10-27 NOTE — Plan of Care (Signed)
Problem: Phase I Progression Outcomes Goal: Flu/PneumoVaccines if indicated Outcome: Progressing Ordered flu

## 2014-10-27 NOTE — Progress Notes (Signed)
Pt has been sleeping all am however arrived to unit late from ED, when staff tried to wake pt up to take meds pt would respond to sternal rub then fall back asleep, at 935 VS taken BP 125/77 HR 80 O2 100% on 3L CBG 170, charge RN called to evaluate, pt stable, it was reported that pt is heavy sleeper, when pt opened her eyes staff asked pt where she was she said "hospital", pt still sleeping, and staff has been in the room frequently

## 2014-10-27 NOTE — Progress Notes (Signed)
CSW notified of patient's admission from [facility]- SNF. Spoke with Tresa Endo- Admissions at [facility]. She stated that patient is short term- rehab and they will plan to accept her back when medically stable.  SW Psychosocial Assessment to follow.  Lorri Frederick. Jaci Lazier, [place] [phone]

## 2014-10-27 NOTE — Progress Notes (Signed)
PROGRESS NOTE  [name] WUJ:[id number] DOB: [birth year] DOA: 10/26/2014 PCP: Ginette Otto, MD  Summary: 81 year old woman with history of asthma, wheezing, dyspnea.  Assessment/Plan: 1. Acute hypoxic, hypercapnic respiratory failure with modest respiratory acidosis. Oxygen requirement stable. She is awake and alert and participates with exam. Able to have a conversation on the phone. Clinically appears hypercapnia has resolved. 2. Acute asthma exacerbation appears to be improving. 3. Elevated troponins. Flat. EKG nonacute. Troponins elevated on previous admission. No reported chest pain. No further evaluation suggested. 4. Acute encephalopathy, according to the chart granddaughter reported the patient is always confused after falling asleep. Granddaughter declined CT head. Appears resolved. 5. Chronic atrial fibrillation on Eliquis 6. Mild to moderate aortic stenosis, moderate pulmonary hypertension. 2-D echocardiogram 10/01/2014 with normal LVEF, no comment on diastolic function 7. CKD Stage III likely at baseline.   Overall appears to be improving. To continue steroids, bronchodilators, wean oxygen.  Anticipate discharge next 48 hours  Code Status: DNR DVT prophylaxis: Eliquis Family Communication: none present Disposition Plan:  [facility]  Brendia Sacks, MD  [facility]  Pager [phone] If 7PM-7AM, please contact night-coverage at [email or web address], password [facility] 10/27/2014, 10:03 AM    Consultants:    Procedures:    Antibiotics:    HPI/Subjective: Still feels short of breath, some wheezing. No pain.   Objective: Filed Vitals:   10/27/14 0416 10/27/14 0728 10/27/14 0800 10/27/14 0935  BP: 128/74  125/73 125/77  Pulse: 89  86 80  Temp: 97.2 F (36.2 C)  97.4 F (36.3 C) 97.8 F (36.6 C)  TempSrc: Oral  Oral Axillary  Resp: 18  18   Height:      Weight:      SpO2: 98% 95% 100% 100%    Intake/Output Summary (Last 24  hours) at 10/27/14 1003 Last data filed at 10/27/14 0014  Gross per 24 hour  Intake     50 ml  Output      0 ml  Net     50 ml     Filed Weights   10/26/14 1916 10/27/14 0044  Weight: 68.493 kg (151 lb) 64.8 kg (142 lb 13.7 oz)    Exam:     Afebrile, vital signs stable, SPO2 100% on 3 L General: Appears calm and comfortable Cardiovascular: RRR, no m/r/g. No LE edema. Telemetry: afib Respiratory: CTA bilaterally, no w/r/r. Normal respiratory effort. Abdomen: soft, ntnd Musculoskeletal: grossly normal tone BUE/BLE Psychiatric: grossly normal mood and affect, speech fluent and appropriate Neurologic: grossly non-focal.  New data reviewed:  Troponin 0.06  >> 0.08  >> 0.07  BMP c/w CKD stage III  LFTs unremarkable  Pertinent data since admission:  CXR: Cardiomegaly. No evidence for acute pulmonary abnormality.  ABG 7.34/55/84  EKG atrial fibrillation/flutter rate controlled, no acute changes, compared to previous study 10/01/2014 atrial fibrillation is old  Pending data:    Scheduled Meds: . apixaban  5 mg Oral BID  . calcium carbonate  500 mg of elemental calcium Oral BID WC  . diltiazem  180 mg Oral Daily  . furosemide  20 mg Oral QODAY  . [START ON 10/28/2014] Influenza vac split quadrivalent PF  0.5 mL Intramuscular Tomorrow-1000  . ipratropium-albuterol  3 mL Nebulization Q6H  . methylPREDNISolone (SOLU-MEDROL) injection  80 mg Intravenous Q12H  . metoprolol tartrate  12.5 mg Oral BID  . potassium chloride SA  20 mEq Oral Daily  . prednisoLONE acetate  1 drop Both Eyes QID  . sodium chloride  3 mL Intravenous Q12H   Continuous Infusions: . 0.9 % NaCl with KCl 20 mEq / L 50 mL/hr at 10/27/14 8119    Principal Problem:   Asthma exacerbation Active Problems:   Hypertension   Arthritis   Chronic atrial fibrillation   Time spent 20 minutes

## 2014-10-27 NOTE — H&P (Signed)
[facility] History and Physical  MIYONNA ORMISTON VWU:[id number] DOB: Jul 09, 1918 DOA: 10/26/2014  Referring physician:  PCP: Ginette Otto, MD   Chief Complaint: Glynn Octave, MD  HPI: [name] is a 81 y.o. female with a past medical history of asthma, chronic atrial fibrillation, on chronic anticoagulation, hypertension, arthritis, CVA/ TIA who was brought from an assisted living facility due to a 3 day history of cough, wheezing and dyspnea. Apparently there hasn't been a fever, no cold like symptoms or any other symptomatology. Patient is currently sleeping, and unable to provide further history. Per her granddaughter, she regularly sleeps very deeply and gets difficult to fully arouse. She declined CT scan of the brain and stated that this is the patient's regular sleep pattern.   Review of Systems:  Unable to review.  Past Medical History  Diagnosis Date  . Hypertension   . Chronic atrial fibrillation   . Arthritis   . Chronic combined systolic and diastolic CHF (congestive heart failure)     a. Dx 2013, EF 45-50%, felt due in part to rapid AF.  Marland Kitchen Asthma   . Hypokalemia   . TIA (transient ischemic attack)     a. Question TIA during 2013 admission with word finding difficulty, already on Coumadin at the time.  . CKD (chronic kidney disease), stage III     a. Per review of chart, likely CKD stage III in 2013..  . Stroke    Past Surgical History  Procedure Laterality Date  . Other surgical history      bilateral hip replacement  . Joint replacement      bilateral hip replacement   Social History:  reports that she has never smoked. She does not have any smokeless tobacco history on file. She reports that she does not drink alcohol or use illicit drugs.  Allergies  Allergen Reactions  . Shrimp [Shellfish Allergy] Swelling    Family History  Problem Relation Age of Onset  . Heart failure Mother   . Heart attack Sister   . Heart attack  Brother   . Stroke Mother   . Stroke Maternal Aunt   . Hypertension Mother   . Hypertension Sister   . Hypertension Brother     Prior to Admission medications   Medication Sig Start Date End Date Taking? Authorizing Provider  acetaminophen (TYLENOL) 325 MG tablet Take 2 tablets (650 mg total) by mouth every 6 (six) hours as needed for headache. 04/16/14  Yes Alison Murray, MD  alum & mag hydroxide-simeth (MAALOX/MYLANTA) 200-200-20 MG/5ML suspension Take 30 mLs by mouth every 4 (four) hours as needed for indigestion or heartburn. 04/16/14  Yes Alison Murray, MD  apixaban (ELIQUIS) 5 MG TABS tablet Take 1 tablet (5 mg total) by mouth 2 (two) times daily. 10/02/14  Yes Joseph Art, DO  diltiazem (CARDIZEM CD) 180 MG 24 hr capsule Take 1 capsule (180 mg total) by mouth daily. 04/16/14  Yes Alison Murray, MD  furosemide (LASIX) 20 MG tablet Take 1 tablet (20 mg total) by mouth every other day. 10/02/14  Yes Joseph Art, DO  metoprolol tartrate (LOPRESSOR) 25 MG tablet Take 0.5 tablets (12.5 mg total) by mouth 2 (two) times daily. 04/16/14  Yes Alison Murray, MD  Oyster Shell (OYSTER CALCIUM) 500 MG TABS tablet Take 500 mg of elemental calcium by mouth 2 (two) times daily.   Yes Historical Provider, MD  potassium chloride SA (K-DUR,KLOR-CON) 20 MEQ tablet Take 20 mEq  by mouth daily.   Yes Historical Provider, MD  pravastatin (PRAVACHOL) 10 MG tablet Take 10 mg by mouth every evening.   Yes Historical Provider, MD  prednisoLONE acetate (PRED FORTE) 1 % ophthalmic suspension Place 1 drop into both eyes 4 (four) times daily.   Yes Historical Provider, MD  tiotropium (SPIRIVA) 18 MCG inhalation capsule Place 18 mcg into inhaler and inhale daily as needed (shortness of breath, wheezing).   Yes Historical Provider, MD  atorvastatin (LIPITOR) 10 MG tablet Take 1 tablet (10 mg total) by mouth daily at 6 PM. Patient not taking: Reported on 10/26/2014 10/02/14   Joseph Art, DO   Physical Exam: Filed  Vitals:   10/26/14 2315 10/26/14 2330 10/26/14 2345 10/27/14 0000  BP: 107/53 107/54 116/68 111/49  Pulse: 94 93 85 90  Temp:      TempSrc:      Resp: Height:      Weight:      SpO2: 99% 97% 98% 97%    Wt Readings from Last 3 Encounters:  10/26/14 68.493 kg (151 lb)  10/02/14 68.493 kg (151 lb)  06/08/14 67.132 kg (148 lb)    General:  Sleeping. Appears comfortable Eyes: PERRL, normal lids, irises & conjunctiva ENT: grossly normal hearing, lips & tongue Neck: no LAD, masses or thyromegaly Cardiovascular: Irregularly irregular, No LE edema. Telemetry: Atrial fibrillation  Respiratory: Minimal wheezing bilaterally Abdomen: soft, ntnd Skin: no rash or induration seen on limited exam Musculoskeletal: grossly normal tone BUE/BLE Psychiatric: Sleeping Neurologic: Sleeping.          Labs on Admission:  Basic Metabolic Panel:  Recent Labs Lab 10/26/14 1942  NA 139  K 3.6  CL 100*  CO2 28  GLUCOSE 125*  BUN 15  CREATININE 1.20*  CALCIUM 10.0   Liver Function Tests:  Recent Labs Lab 10/26/14 1942  AST 20  ALT 9*  ALKPHOS 79  BILITOT 0.8  PROT 7.6  ALBUMIN 3.0*   CBC:  Recent Labs Lab 10/26/14 1942  WBC 14.1*  NEUTROABS 10.1*  HGB 13.1  HCT 42.0  MCV 89.0  PLT 266   Cardiac Enzymes:  Recent Labs Lab 10/26/14 1942  TROPONINI 0.06*    BNP (last 3 results)  Recent Labs  10/26/14 1949  BNP 342.6*    Radiological Exams on Admission: Dg Chest 2 View  10/26/2014   CLINICAL DATA:  Shortness of breath. Cough for 3 days. History of asthma. Patient motion.  EXAM: CHEST  2 VIEW  COMPARISON:  09/30/2014  FINDINGS: Heart is accentuated by technique and mildly enlarged. There are no focal consolidations. No pleural effusions or pulmonary edema.  IMPRESSION: Cardiomegaly.  No evidence for acute pulmonary abnormality.   Electronically Signed   By: Norva Pavlov M.D.   On: 10/26/2014 20:44     Echocardiogram: ------------------------------------------------------------------- Indications:   CVA 436.  ------------------------------------------------------------------- History:  PMH: Arthritis. Asthma. Chronic kidney disease. Atrial fibrillation. Congestive heart failure. Transient ischemic attack. Risk factors: Hypertension.  ------------------------------------------------------------------- Study Conclusions  - Left ventricle: The cavity size was normal. There was moderate concentric hypertrophy. Systolic function was normal. Wall motion was normal; there were no regional wall motion abnormalities. - Aortic valve: Severe diffuse thickening. Severe diffuse calcification. There was mild to moderate stenosis. There was mild regurgitation. Valve area (VTI): 0.69 cm^2. Valve area (Vmax): 0.73 cm^2. Valve area (Vmean): 0.63 cm^2. - Mitral valve: Thickened and calcified papillary muscles. Mild thickening and calcification. There was mild regurgitation. -  Tricuspid valve: There was mild-moderate regurgitation. - Pulmonic valve: There was mild regurgitation. - Pulmonary arteries: PA peak pressure: 45 mm Hg (S).  Impressions:  - The right ventricular systolic pressure was increased consistent with moderate pulmonary hypertension.    EKG: Independently reviewed. Vent. rate 98 BPM PR interval * ms QRS duration 86 ms QT/QTc 370/472 ms P-R-T axes -1 80 -73  Atrial fibrillation Ventricular premature complex Borderline repol abnormality, diffuse leads  Assessment/Plan Principal Problem:   Asthma exacerbation Admit to telemetry for observation. Continue duo nebs every 6 hours. Continue Solu-Medrol every 12 hours. Continue supplemental oxygen.  Active Problems:   Hypertension Continue current antihypertensive therapy and monitor blood pressure.    Arthritis Tylenol as needed.    Chronic atrial fibrillation Continue cardiac  monitoring. Continue Diltiazem, Metoprolol and anticoagulation therapy. Optimize electrolytes (potassium and magnesium) while on albuterol. Switch to Xopenex if RVR develops. Follow-up troponin level.    Code Status: DO NOT RESUSCITATE/DO NOT INTUBATE DVT Prophylaxis: On Eliquis. Family Communication:  Allen Kell    [phone]  Her granddaughter Joice Lofts stated that the patient has discussed in the past with her 3 children and Ms. Gerlene Burdock, as recently as last month, that she does not wishes to be intubated or resuscitated.  Disposition Plan: Admit for treatment and observation.  Time spent: Over 70 minutes were spent during the process of this admission.  Bobette Mo [facility] Pager [phone].

## 2014-10-27 NOTE — Progress Notes (Signed)
Pt woke up and ate lunch, pt up to chair with walker and assist x2, pt was on 3L and now is on 2L, on room air pt was 88%, pt stable, urine sent

## 2014-10-28 DIAGNOSIS — N189 Chronic kidney disease, unspecified: Secondary | ICD-10-CM | POA: Insufficient documentation

## 2014-10-28 DIAGNOSIS — N183 Chronic kidney disease, stage 3 (moderate): Secondary | ICD-10-CM

## 2014-10-28 DIAGNOSIS — R7989 Other specified abnormal findings of blood chemistry: Secondary | ICD-10-CM | POA: Diagnosis not present

## 2014-10-28 DIAGNOSIS — I10 Essential (primary) hypertension: Secondary | ICD-10-CM

## 2014-10-28 DIAGNOSIS — R778 Other specified abnormalities of plasma proteins: Secondary | ICD-10-CM | POA: Insufficient documentation

## 2014-10-28 DIAGNOSIS — J9601 Acute respiratory failure with hypoxia: Secondary | ICD-10-CM | POA: Insufficient documentation

## 2014-10-28 DIAGNOSIS — G9340 Encephalopathy, unspecified: Secondary | ICD-10-CM

## 2014-10-28 DIAGNOSIS — I482 Chronic atrial fibrillation: Secondary | ICD-10-CM

## 2014-10-28 DIAGNOSIS — J45901 Unspecified asthma with (acute) exacerbation: Secondary | ICD-10-CM

## 2014-10-28 LAB — BASIC METABOLIC PANEL
Anion gap: 8 (ref 5–15)
BUN: 30 mg/dL — AB (ref 6–20)
CHLORIDE: 102 mmol/L (ref 101–111)
CO2: 28 mmol/L (ref 22–32)
CREATININE: 1.43 mg/dL — AB (ref 0.44–1.00)
Calcium: 10.2 mg/dL (ref 8.9–10.3)
GFR calc Af Amer: 35 mL/min — ABNORMAL LOW (ref >60)
GFR calc non Af Amer: 30 mL/min — ABNORMAL LOW (ref >60)
GLUCOSE: 155 mg/dL — AB (ref 65–99)
POTASSIUM: 5.6 mmol/L — AB (ref 3.5–5.1)
SODIUM: 138 mmol/L (ref 135–145)

## 2014-10-28 LAB — POTASSIUM: Potassium: 4.1 mmol/L (ref 3.5–5.1)

## 2014-10-28 MED ORDER — PREDNISONE 10 MG PO TABS: ORAL_TABLET | ORAL | Status: AC

## 2014-10-28 NOTE — Discharge Summary (Signed)
Physician Discharge Summary  [name] AVW:[id number] DOB: 1918/02/12 DOA: 10/26/2014  PCP: Ginette Otto, MD  Admit date: 10/26/2014 Discharge date: 10/28/2014  Time spent: 45 minutes  Recommendations for Outpatient Follow-up:  Patient will be discharged to [facility].  Patient should continue therapy as recommended by the facility.  Patient will need to follow up with primary care provider within one week of discharge.  Patient should continue medications as prescribed.  Patient should follow a heart healthy diet.   Discharge Diagnoses:  Acute hypoxic, hypercapnic respiratory failure Asthma exacerbation Elevated troponin Acute encephalopathy Chronic atrial fibrillation Mild to moderate aortic stenosis/pulmonary hypertension  Chronic kidney disease, stage III  Discharge Condition: Stable  Diet recommendation: Heart healthy  Filed Weights   10/26/14 1916 10/27/14 0044 10/28/14 0521  Weight: 68.493 kg (151 lb) 64.8 kg (142 lb 13.7 oz) 66.271 kg (146 lb 1.6 oz)    History of present illness:  On 10/27/2014 by Dr. Sanda Klein [name] is a 81 y.o. female with a past medical history of asthma, chronic atrial fibrillation, on chronic anticoagulation, hypertension, arthritis, CVA/ TIA who was brought from an assisted living facility due to a 3 day history of cough, wheezing and dyspnea. Apparently there hasn't been a fever, no cold like symptoms or any other symptomatology. Patient is currently sleeping, and unable to provide further history. Per her granddaughter, she regularly sleeps very deeply and gets difficult to fully arouse. She declined CT scan of the brain and stated that this is the patient's regular sleep pattern.  Hospital Course:  Acute hypoxic, hypercapnic respiratory failure with respiratory acidosis/Asthma Exacerbation -Hypercapnia appears to have resolved -Resolved, no wheezing on exam -Patient was placed on sterile, bronchodilators -Weaned off of  O2, Saturations on room air 90-93% -Continue steroid taper and home medications  Elevated troponins -Troponins flat -Troponins were elevated on previous admission as well, EKG shows no acute changes -Patient denies any chest pain  Acute encephalopathy -? Underlying dementia -Per review of chart, granddaughter reports patient is always confused -CT of the head was declined by family -Patient appears to be at her baseline and is able to answer questions appropriately  Chronic atrial fibrillation -Continue Eliquis -PCP or cardiologist may consider dose reduction given age and kidney function  Mild to moderate aortic stenosis/pulmonary hypertension -Echocardiogram 10/01/2014 shows normal EF, no comment on diastolic dysfunction  Chronic kidney disease, stage III -Creatinine appears to be at baseline, currently 1.4  Code: DNR  Procedures: None  Consultations: None  Discharge Exam: Filed Vitals:   10/28/14 1126  BP: 123/74  Pulse: 62  Temp: 98 F (36.7 C)  Resp: 18     General: Well developed, well nourished, NAD, appears stated age  HEENT: NCAT, mucous membranes moist.  Cardiovascular: S1 S2 auscultated, no rubs, murmurs or gallops, irregular  Respiratory: Clear to auscultation bilaterally with equal chest rise  Abdomen: Soft, nontender, nondistended, + bowel sounds  Extremities: warm dry without cyanosis clubbing or edema  Neuro: AAOx3, nonfocal, answers questions appropriately  Discharge Instructions      Discharge Instructions    Discharge instructions    Complete by:  As directed   Patient will be discharged to [facility].  Patient should continue therapy as recommended by the facility.  Patient will need to follow up with primary care provider within one week of discharge.  Patient should continue medications as prescribed.  Patient should follow a heart healthy diet.            Medication  List    STOP taking these medications         atorvastatin 10 MG tablet  Commonly known as:  LIPITOR      TAKE these medications        acetaminophen 325 MG tablet  Commonly known as:  TYLENOL  Take 2 tablets (650 mg total) by mouth every 6 (six) hours as needed for headache.     alum & mag hydroxide-simeth 200-200-20 MG/5ML suspension  Commonly known as:  MAALOX/MYLANTA  Take 30 mLs by mouth every 4 (four) hours as needed for indigestion or heartburn.     apixaban 5 MG Tabs tablet  Commonly known as:  ELIQUIS  Take 1 tablet (5 mg total) by mouth 2 (two) times daily.     diltiazem 180 MG 24 hr capsule  Commonly known as:  CARDIZEM CD  Take 1 capsule (180 mg total) by mouth daily.     furosemide 20 MG tablet  Commonly known as:  LASIX  Take 1 tablet (20 mg total) by mouth every other day.     metoprolol tartrate 25 MG tablet  Commonly known as:  LOPRESSOR  Take 0.5 tablets (12.5 mg total) by mouth 2 (two) times daily.     oyster calcium 500 MG Tabs tablet  Take 500 mg of elemental calcium by mouth 2 (two) times daily.     potassium chloride SA 20 MEQ tablet  Commonly known as:  K-DUR,KLOR-CON  Take 20 mEq by mouth daily.     pravastatin 10 MG tablet  Commonly known as:  PRAVACHOL  Take 10 mg by mouth every evening.     prednisoLONE acetate 1 % ophthalmic suspension  Commonly known as:  PRED FORTE  Place 1 drop into both eyes 4 (four) times daily.     predniSONE 10 MG tablet  Commonly known as:  DELTASONE  Take  Prednisone  (4 tabs) x 3 days, then taper to  (3 tabs) x 3 days, then  (2 tabs) x 3days, then  (1 tab) x 3days, then OFF.     tiotropium 18 MCG inhalation capsule  Commonly known as:  SPIRIVA  Place 18 mcg into inhaler and inhale daily as needed (shortness of breath, wheezing).       Allergies  Allergen Reactions  . Shrimp [Shellfish Allergy] Swelling   Follow-up Information    Follow up with Ginette Otto, MD. Schedule an appointment as soon as possible for a visit in 1  week.   Specialty:  Internal Medicine   Why:  Hospital followup   Contact information:   [address] [place] [address] [phone]        The results of significant diagnostics from this hospitalization (including imaging, microbiology, ancillary and laboratory) are listed below for reference.    Significant Diagnostic Studies: Dg Chest 2 View  10/26/2014   CLINICAL DATA:  Shortness of breath. Cough for 3 days. History of asthma. Patient motion.  EXAM: CHEST  2 VIEW  COMPARISON:  09/30/2014  FINDINGS: Heart is accentuated by technique and mildly enlarged. There are no focal consolidations. No pleural effusions or pulmonary edema.  IMPRESSION: Cardiomegaly.  No evidence for acute pulmonary abnormality.   Electronically Signed   By: Norva Pavlov M.D.   On: 10/26/2014 20:44   Dg Chest 2 View  09/30/2014   CLINICAL DATA:  81 year old female with syncope today.  EXAM: CHEST  2 VIEW  COMPARISON:  04/14/2014 and prior radiographs  FINDINGS: Cardiomegaly  is again identified.  Mild interstitial prominence is unchanged.  There is no evidence of focal airspace disease, pulmonary edema, suspicious pulmonary nodule/mass, pleural effusion, or pneumothorax.  No acute bony abnormalities are identified.  IMPRESSION: Cardiomegaly without evidence of acute cardiopulmonary disease.   Electronically Signed   By: Harmon Pier M.D.   On: 09/30/2014 14:36   Ct Head Wo Contrast  09/30/2014   CLINICAL DATA:  Patient found down after walking multiple blocks. Confused. No reported loss of consciousness.  EXAM: CT HEAD WITHOUT CONTRAST  TECHNIQUE: Contiguous axial images were obtained from the base of the skull through the vertex without intravenous contrast.  COMPARISON:  Brain CT 08/30/2011; MR brain 08/30/2011  FINDINGS: Left occipital lobe encephalomalacia with ex vacuo dilatation of the left lateral ventricle. Cortical atrophy. Chronic small vessel ischemic changes. Right basal ganglia lacunar  infarct. No evidence for acute cortically based infarct, intracranial hemorrhage, mass lesion or mass-effect. The orbits are unremarkable. The paranasal sinuses are unremarkable. The mastoid air cells are well aerated. Calvarium is intact.  IMPRESSION: Age-indeterminate but likely chronic left occipital lobe infarct.  Chronic small vessel ischemic changes and cortical atrophy.  No acute intracranial process.   Electronically Signed   By: Annia Belt M.D.   On: 09/30/2014 13:59   Mr Shirlee Latch Wo Contrast  09/30/2014   CLINICAL DATA:  81 year old female found with right facial droop, dysarthria, right side weakness. Initial encounter.  EXAM: MRI HEAD WITHOUT CONTRAST  MRA HEAD WITHOUT CONTRAST  TECHNIQUE: Multiplanar, multiecho pulse sequences of the brain and surrounding structures were obtained without intravenous contrast. Angiographic images of the head were obtained using MRA technique without contrast.  COMPARISON:  Head CT without contrast 1347 hr today. Brain MRI 08/30/2011.  FINDINGS: MRI HEAD FINDINGS  Major intracranial vascular flow voids are stable. No restricted diffusion to suggest acute infarction. No midline shift, mass effect, evidence of mass lesion, ventriculomegaly, extra-axial collection or acute intracranial hemorrhage. Cervicomedullary junction and pituitary are within normal limits.  Cerebral volume is not significantly changed since 2013. Patchy and confluent cerebral white matter T2 and FLAIR hyperintensity. There is now chronic encephalomalacia in the left occipital pole (series 10, image 6). Associated hemosiderin. T2 heterogeneity in the deep gray matter nuclei is stable. Chronic micro hemorrhage in the cerebellar hemispheres has progressed since 2013. No other cortical encephalomalacia.  Negative visualized cervical spine. Bone marrow signal is stable and within normal limits. Visible internal auditory structures appear normal. Paranasal sinuses and mastoids are clear. Negative orbit  and scalp soft tissues.  MRA HEAD FINDINGS  Study is mildly degraded by motion artifact despite repeated imaging attempts.  Dominant distal right vertebral artery. Antegrade flow in the posterior circulation. The left vertebral appears to functionally terminate in PICA. No basilar artery stenosis. SCA and left PCA origin are normal. Fetal type right PCA origin. Negative right PCA branches. Moderate irregularity and tandem stenosis in the left PCA P2 segment with preserved distal flow. Left posterior communicating artery is diminutive or absent.  Antegrade flow in both ICA siphons. Mild to moderate siphon irregularity but no hemodynamically significant stenosis. Ophthalmic and right posterior communicating artery origins are within normal limits. Patent carotid termini. MCA origins, M1 segments, and MCA bifurcations are within normal limits. No major MCA branch occlusion. Mild to moderate bilateral MCA branch irregularity.  Mild to moderate bilateral A1 ACA segment irregularity and stenosis. Diminutive or absent anterior communicating artery. Moderate to severe bilateral A2 segment stenosis with preserved distal flow.  IMPRESSION: 1.  No acute intracranial abnormality. 2. Chronic left PCA infarct and bilateral cerebellar micro hemorrhage have progressed since 2013. Chronic small vessel disease. 3. Intracranial atherosclerosis but no major circle of Willis branch occlusion. Moderate to severe stenoses of the bilateral ACA A2 segments and left PCA P2 segment.   Electronically Signed   By: Odessa Fleming M.D.   On: 09/30/2014 18:46   Mr Brain Wo Contrast  09/30/2014   CLINICAL DATA:  81 year old female found with right facial droop, dysarthria, right side weakness. Initial encounter.  EXAM: MRI HEAD WITHOUT CONTRAST  MRA HEAD WITHOUT CONTRAST  TECHNIQUE: Multiplanar, multiecho pulse sequences of the brain and surrounding structures were obtained without intravenous contrast. Angiographic images of the head were obtained  using MRA technique without contrast.  COMPARISON:  Head CT without contrast 1347 hr today. Brain MRI 08/30/2011.  FINDINGS: MRI HEAD FINDINGS  Major intracranial vascular flow voids are stable. No restricted diffusion to suggest acute infarction. No midline shift, mass effect, evidence of mass lesion, ventriculomegaly, extra-axial collection or acute intracranial hemorrhage. Cervicomedullary junction and pituitary are within normal limits.  Cerebral volume is not significantly changed since 2013. Patchy and confluent cerebral white matter T2 and FLAIR hyperintensity. There is now chronic encephalomalacia in the left occipital pole (series 10, image 6). Associated hemosiderin. T2 heterogeneity in the deep gray matter nuclei is stable. Chronic micro hemorrhage in the cerebellar hemispheres has progressed since 2013. No other cortical encephalomalacia.  Negative visualized cervical spine. Bone marrow signal is stable and within normal limits. Visible internal auditory structures appear normal. Paranasal sinuses and mastoids are clear. Negative orbit and scalp soft tissues.  MRA HEAD FINDINGS  Study is mildly degraded by motion artifact despite repeated imaging attempts.  Dominant distal right vertebral artery. Antegrade flow in the posterior circulation. The left vertebral appears to functionally terminate in PICA. No basilar artery stenosis. SCA and left PCA origin are normal. Fetal type right PCA origin. Negative right PCA branches. Moderate irregularity and tandem stenosis in the left PCA P2 segment with preserved distal flow. Left posterior communicating artery is diminutive or absent.  Antegrade flow in both ICA siphons. Mild to moderate siphon irregularity but no hemodynamically significant stenosis. Ophthalmic and right posterior communicating artery origins are within normal limits. Patent carotid termini. MCA origins, M1 segments, and MCA bifurcations are within normal limits. No major MCA branch occlusion.  Mild to moderate bilateral MCA branch irregularity.  Mild to moderate bilateral A1 ACA segment irregularity and stenosis. Diminutive or absent anterior communicating artery. Moderate to severe bilateral A2 segment stenosis with preserved distal flow.  IMPRESSION: 1.  No acute intracranial abnormality. 2. Chronic left PCA infarct and bilateral cerebellar micro hemorrhage have progressed since 2013. Chronic small vessel disease. 3. Intracranial atherosclerosis but no major circle of Willis branch occlusion. Moderate to severe stenoses of the bilateral ACA A2 segments and left PCA P2 segment.   Electronically Signed   By: Odessa Fleming M.D.   On: 09/30/2014 18:46    Microbiology: Recent Results (from the past 240 hour(s))  MRSA PCR Screening     Status: None   Collection Time: 10/27/14  2:15 AM  Result Value Ref Range Status   MRSA by PCR NEGATIVE NEGATIVE Final    Comment:        The GeneXpert MRSA Assay (FDA approved for NASAL specimens only), is one component of a comprehensive MRSA colonization surveillance program. It is not intended to diagnose MRSA infection nor to  guide or monitor treatment for MRSA infections.      Labs: Basic Metabolic Panel:  Recent Labs Lab 10/26/14 1942 10/27/14 0134 10/27/14 0553 10/28/14 0849 10/28/14 1217  NA 139 139  --  138  --   K 3.6 3.9 4.5 5.6* 4.1  CL 100* 99*  --  102  --   CO2 28 26  --  28  --   GLUCOSE 125* 217*  --  155*  --   BUN 15 19  --  30*  --   CREATININE 1.20* 1.52*  --  1.43*  --   CALCIUM 10.0 10.0  --  10.2  --    Liver Function Tests:  Recent Labs Lab 10/26/14 1942 10/27/14 0134  AST 20 25  ALT 9* 10*  ALKPHOS 79 75  BILITOT 0.8 0.5  PROT 7.6 7.0  ALBUMIN 3.0* 2.8*   No results for input(s): LIPASE, AMYLASE in the last 168 hours. No results for input(s): AMMONIA in the last 168 hours. CBC:  Recent Labs Lab 10/26/14 1942 10/27/14 0134  WBC 14.1* 11.6*  NEUTROABS 10.1*  --   HGB 13.1 12.8  HCT 42.0 41.6    MCV 89.0 90.4  PLT 266 280   Cardiac Enzymes:  Recent Labs Lab 10/26/14 1942 10/27/14 0134 10/27/14 0640  TROPONINI 0.06* 0.08* 0.07*   BNP: BNP (last 3 results)  Recent Labs  10/26/14 1949  BNP 342.6*    ProBNP (last 3 results) No results for input(s): PROBNP in the last 8760 hours.  CBG:  Recent Labs Lab 10/27/14 0925  GLUCAP 170*       Signed:  Edsel Petrin  [facility] 10/28/2014, 12:57 PM

## 2014-10-28 NOTE — Discharge Instructions (Signed)

## 2014-10-28 NOTE — Evaluation (Signed)
Physical Therapy Evaluation Patient Details Name: [name] MRN: [id number] DOB: 1918-07-26 Today's Date: 10/28/2014   History of Present Illness  81 y.o. female with a past medical history of asthma, chronic atrial fibrillation, on chronic anticoagulation, hypertension, arthritis, CVA/ TIA who was brought from [facility] SNF due to a 3 day history of cough, wheezing and dyspnea  Clinical Impression  Pt very pleasant and moving well with difficulty with toileting secondary to incontinence. Pt with increased gait from baseline per pt today and will continue to benefit from acute therapy as well as SNF to maximize function, gait and balance as pt ultimate goal is to return to ALF.    Follow Up Recommendations SNF    Equipment Recommendations  None recommended by PT    Recommendations for Other Services       Precautions / Restrictions Precautions Precautions: Fall Precaution Comments: incontinent      Mobility  Bed Mobility               General bed mobility comments: in chair on arrival  Transfers Overall transfer level: Modified independent                  Ambulation/Gait Ambulation/Gait assistance: Min guard Ambulation Distance (Feet): 150 Feet Assistive device: Rolling walker (2 wheeled) Gait Pattern/deviations: Step-through pattern;Decreased stride length;Trunk flexed   Gait velocity interpretation: at or above normal speed for age/gender General Gait Details: cues for posture, position in RW and safety with encouragement able to increase distance  Stairs            Wheelchair Mobility    Modified Rankin (Stroke Patients Only)       Balance Overall balance assessment: Needs assistance   Sitting balance-Leahy Scale: Good       Standing balance-Leahy Scale: Poor                               Pertinent Vitals/Pain Pain Assessment: No/denies pain    Home Living Family/patient expects to be discharged to:: Skilled  nursing facility                      Prior Function Level of Independence: Needs assistance   Gait / Transfers Assistance Needed: pt uses a RW at all times and reports distance limited recently  ADL's / Homemaking Assistance Needed: pt has supervision and setup for bathing and dressing, wears adult briefs due to incontinence        Hand Dominance        Extremity/Trunk Assessment   Upper Extremity Assessment: Generalized weakness           Lower Extremity Assessment: Generalized weakness      Cervical / Trunk Assessment: Kyphotic  Communication   Communication: No difficulties  Cognition Arousal/Alertness: Awake/alert Behavior During Therapy: WFL for tasks assessed/performed Overall Cognitive Status: Within Functional Limits for tasks assessed                      General Comments      Exercises        Assessment/Plan    PT Assessment Patient needs continued PT services  PT Diagnosis Difficulty walking;Generalized weakness   PT Problem List Decreased strength;Decreased activity tolerance;Decreased balance;Decreased knowledge of use of DME  PT Treatment Interventions DME instruction;Gait training;Functional mobility training;Therapeutic activities;Therapeutic exercise;Balance training;Patient/family education   PT Goals (Current goals can be found in the Care Plan  section) Acute Rehab PT Goals Patient Stated Goal: return to ALF PT Goal Formulation: With patient Time For Goal Achievement: 11/11/14 Potential to Achieve Goals: Good    Frequency Min 3X/week   Barriers to discharge        Co-evaluation               End of Session Equipment Utilized During Treatment: Gait belt Activity Tolerance: Patient tolerated treatment well Patient left: in chair;with call bell/phone within reach Nurse Communication: Mobility status    Functional Assessment Tool Used: clinical judgement Functional Limitation: Mobility: Walking and moving  around Mobility: Walking and Moving Around Current Status (X9147): At least 1 percent but less than 20 percent impaired, limited or restricted Mobility: Walking and Moving Around Goal Status [phone]): At least 1 percent but less than 20 percent impaired, limited or restricted    Time: 2130-8657 PT Time Calculation (min) (ACUTE ONLY): 20 min   Charges:   PT Evaluation $Initial PT Evaluation Tier I: 1 Procedure     PT G Codes:   PT G-Codes **NOT FOR INPATIENT CLASS** Functional Assessment Tool Used: clinical judgement Functional Limitation: Mobility: Walking and moving around Mobility: Walking and Moving Around Current Status (Q4696): At least 1 percent but less than 20 percent impaired, limited or restricted Mobility: Walking and Moving Around Goal Status [phone]): At least 1 percent but less than 20 percent impaired, limited or restricted    Delorse Lek 10/28/2014, 10:51 AM Delaney Meigs, PT [phone]

## 2014-10-28 NOTE — Clinical Social Work Note (Signed)
Clinical Social Work Assessment  Patient Details  Name: [name] MRN: [id number] Date of Birth: October 17, 1918  Date of referral:  10/28/14               Reason for consult:  Discharge Planning (return to SNF)                Permission sought to share information with:  Family Supports Permission granted to share information::  Yes, Verbal Permission Granted  Name::      Mariann Laster (daughter) and Museum/gallery conservator (Curator))  Agency::     Relationship::     Contact Information:     Housing/Transportation Living arrangements for the past 2 months:  [place] of Information:  Patient Patient Interpreter Needed:  None Criminal Activity/Legal Involvement Pertinent to Current Situation/Hospitalization:  No - Comment as needed Significant Relationships:  Adult Children, Other Family Members Advertising account planner (Curator)) Lives with:  Facility Resident Do you feel safe going back to the place where you live?  Yes Need for family participation in patient care:  No (Coment)  Care giving concerns: Pt worried about whether she should return to rehab or go back to her independent living at [facility].   Social Worker assessment / Academic librarian met with Pt. To discuss returning to [facility] today per okay of MD. Pt was alert, oriented and pleasant , sitting up in chair during visit. Pt states she does have an apartment at [facility] and is looking forward to returning there. CSW discuss need to return to SNF at this time. Pt is agreeable to this plan. SW supervisor has notified admission at [facility] and bed is available. Per admission at [facility] will not be required for return. Pt gave permission to contact daughter and granddaughter regarding discharge.  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  Patient/Family's Understanding of and Emotional Response to Diagnosis, Current Treatment, and Prognosis:  Pt noted to be calm and relaxed in room. Pt was pleases with plan for discharge today, returning to SNF. Pt appears to have a limited understanding of current diagnosis and treatment plan. SW will follow up with daughter or granddaughter.  Emotional Assessment Appearance:  Appears stated age Attitude/Demeanor/Rapport:  Apprehensive, Other (Apprecitative) Affect (typically observed):  Accepting, Stable, Calm Orientation:  Oriented to Self, Oriented to Place, Oriented to  Time, Oriented to Situation Alcohol / Substance use:  Never Used Psych involvement (Current and /or in the community):  No (Comment)  Discharge Needs  Concerns to be addressed:  No discharge needs identified Readmission within the last 30 days:  Yes Current discharge risk:  None Barriers to Discharge:  No Barriers Identified   Soledad Gerlach, Student-SW 10/28/2014, 2:27 PM    Addendum: Pt daughter Mariann Laster was in room and notified of discharge plan, she is pleased with her mother discharging to the facility. [place] intern  [phone]

## 2014-12-07 DEATH — deceased

## 2016-05-24 IMAGING — MR MR HEAD W/O CM
9 of 11 series · 29 of 48 positions shown · non-contrast
Comparison: Head CT without contrast 7118 hr today. Brain MRI
08/30/2011.

CLINICAL DATA: [AGE] female found with right facial droop,
dysarthria, right side weakness. Initial encounter.

EXAM:
MRI HEAD WITHOUT CONTRAST
MRA HEAD WITHOUT CONTRAST
TECHNIQUE: Multiplanar, multiecho pulse sequences of the brain and surrounding
structures were obtained without intravenous contrast. Angiographic
images of the head were obtained using MRA technique without
contrast.

[Series 3: DWI · axial · 3.0mm · 1.09mm/px · z∈[-74,+58]mm · 6 of 90 slices shown (1 of 4)]
[im 1/90]
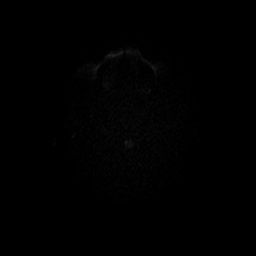
[im 18/90]
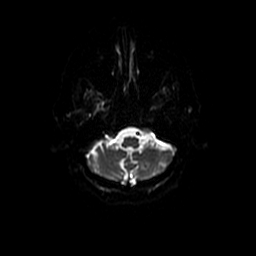
[im 36/90]
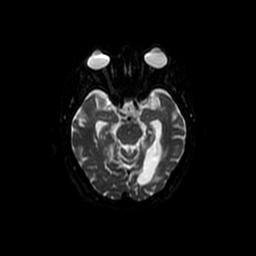
[im 54/90]
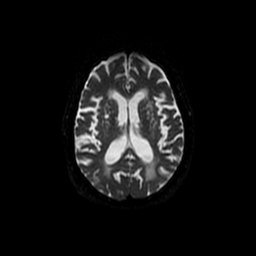
[im 72/90]
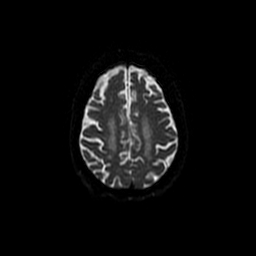
[im 90/90]
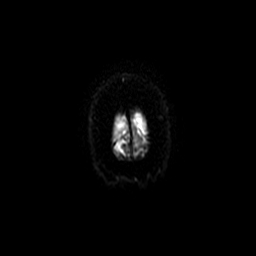

[Series 4: T1 · sagittal · 5.0mm · 0.47mm/px · 2 of 23 slices shown]
[im 1/23]
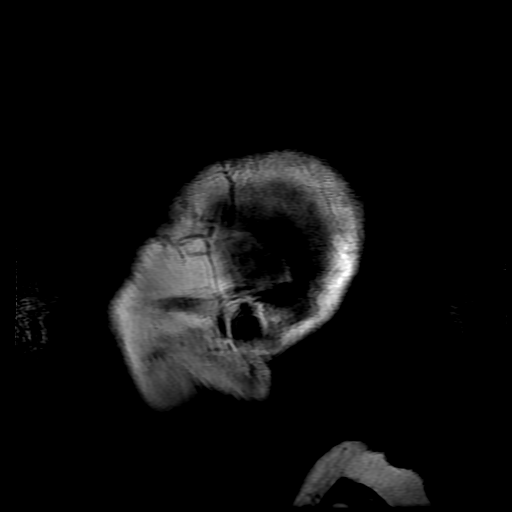
[im 23/23]
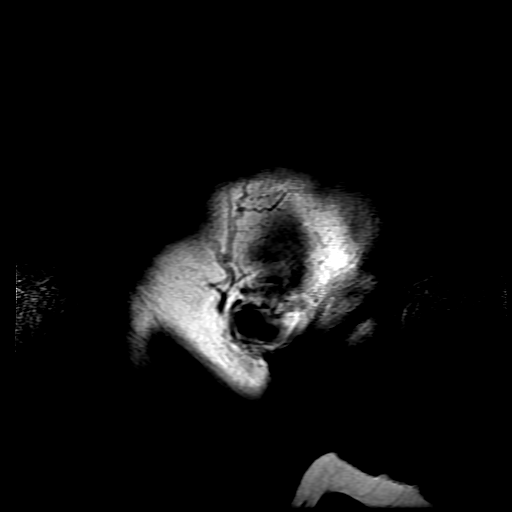

[Series 8: DWI · coronal · 5.0mm · 1.09mm/px · 5 of 58 slices shown (2 of 4)]
[im 1/58]
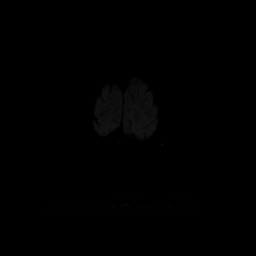
[im 15/58]
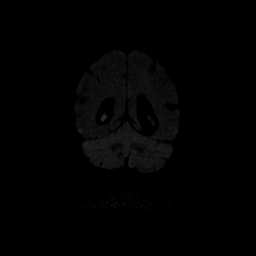
[im 29/58]
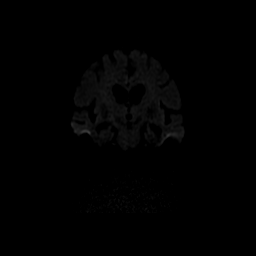
[im 43/58]
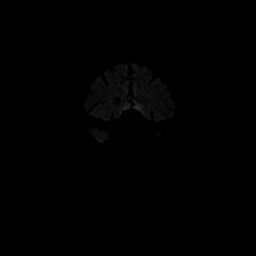
[im 58/58]
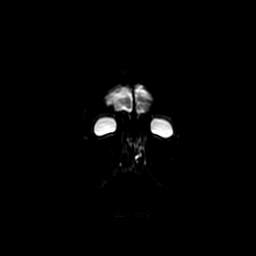

[Series 9: (id) mt fs · axial · 1.4mm · 0.43mm/px · z∈[-86,-43]mm · 4 of 152 slices shown]
[im 1/152]
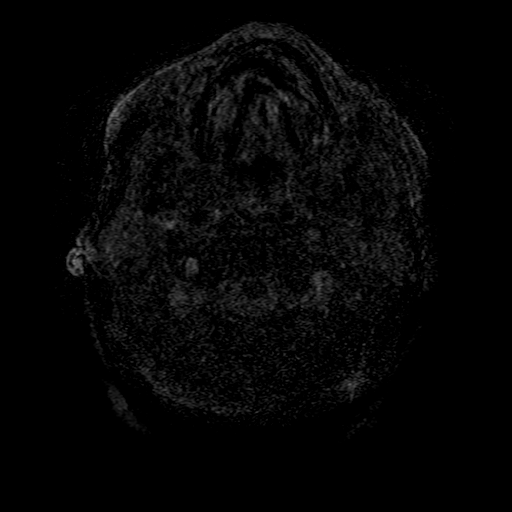
[im 26/152]
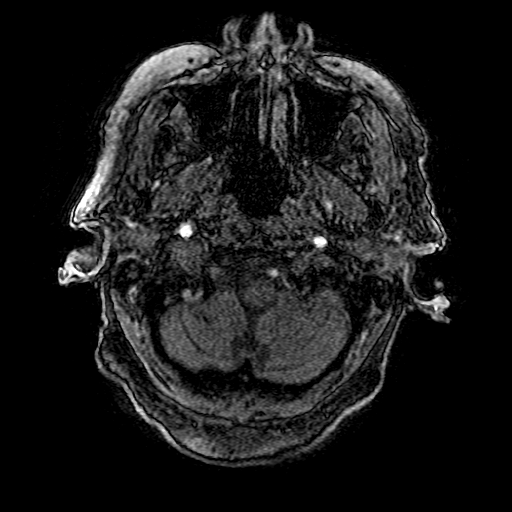
[im 51/152]
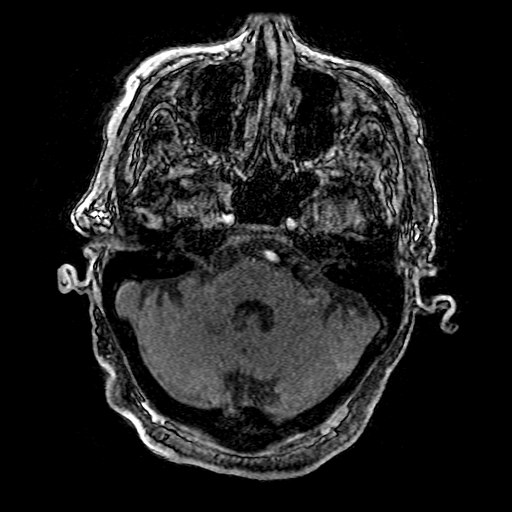
[im 63/152]
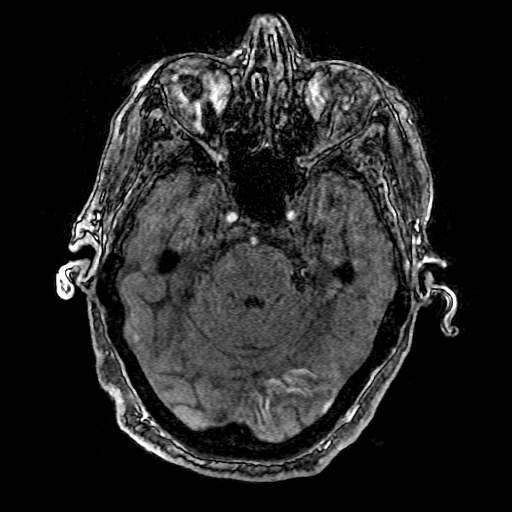

[Series 10: T2 · axial · 5.0mm · 0.43mm/px · z∈[-75,+57]mm · 2 of 23 slices shown (1 of 2)]
[im 1/23]
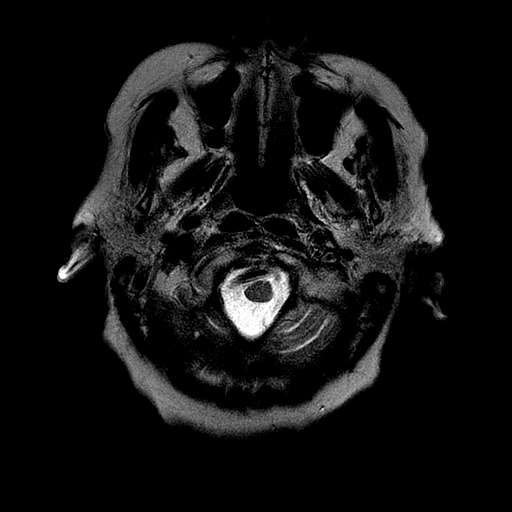
[im 23/23]
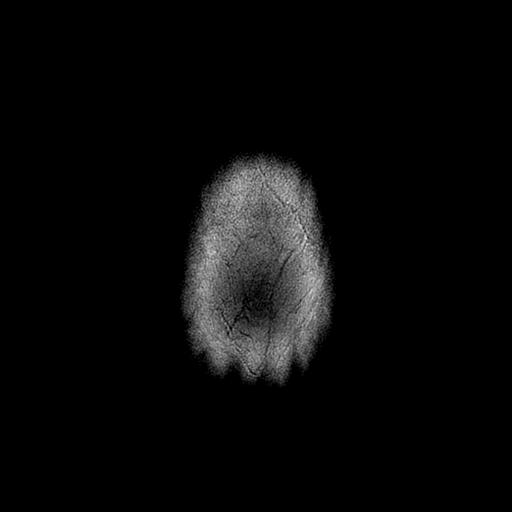

[Series 11: FLAIR · axial · 5.0mm · 0.43mm/px · z∈[-75,+57]mm · 2 of 23 slices shown]
[im 1/23]
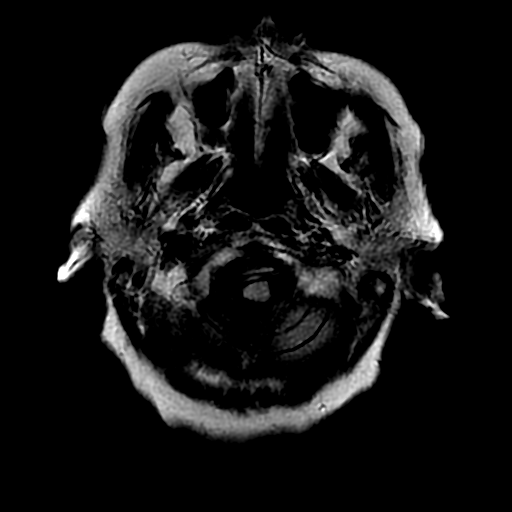
[im 23/23]
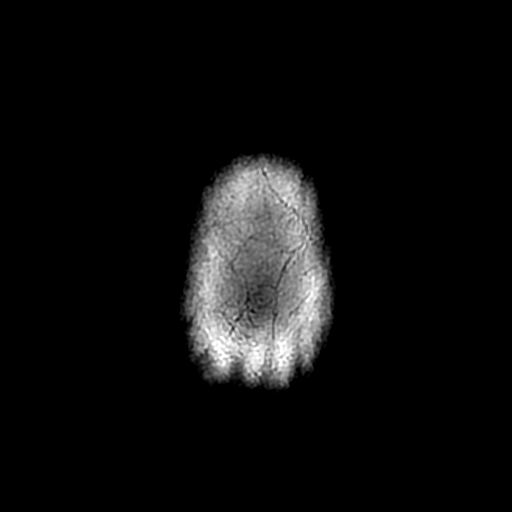

[Series 15: T2 · coronal · 5.0mm · 0.43mm/px · 2 of 24 slices shown (2 of 2)]
[im 1/24]
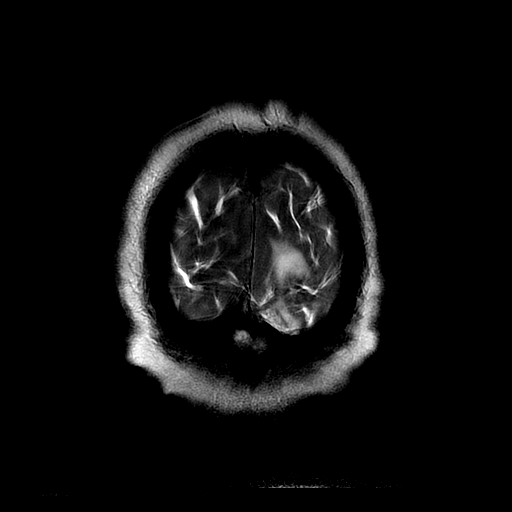
[im 24/24]
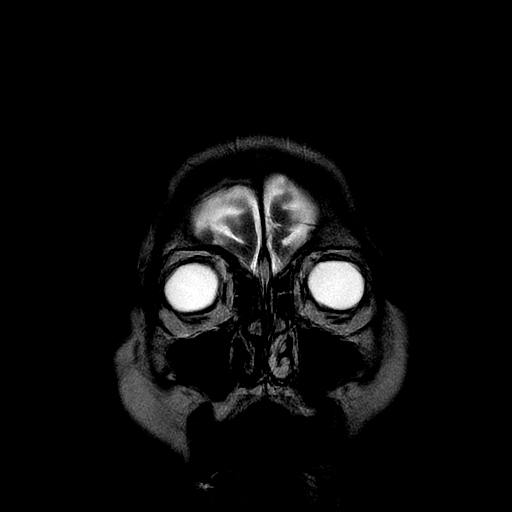

[Series 300: DWI · axial · 3.0mm · 1.09mm/px · z∈[-74,+58]mm · 4 of 45 slices shown (3 of 4)]
[im 1/45]
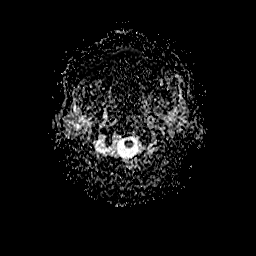
[im 15/45]
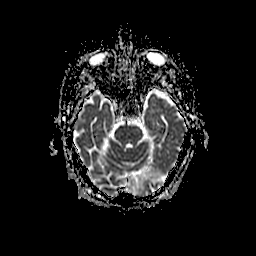
[im 30/45]
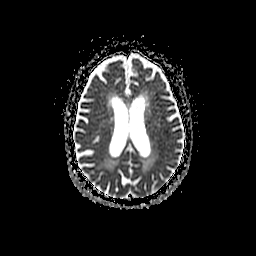
[im 45/45]
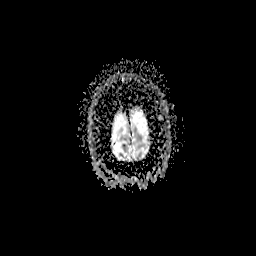

[Series 800: DWI · coronal · 5.0mm · 1.09mm/px · 2 of 29 slices shown (4 of 4)]
[im 1/29]
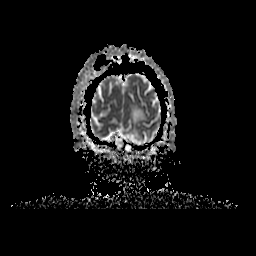
[im 29/29]
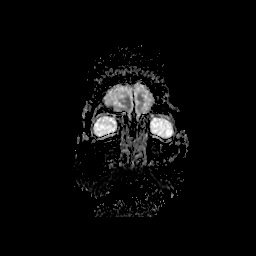

[29 of 48 positions shown; findings below may reference images not displayed]

FINDINGS: MRI HEAD FINDINGS

Major intracranial vascular flow voids are stable. No restricted
diffusion to suggest acute infarction. No midline shift, mass
effect, evidence of mass lesion, ventriculomegaly, extra-axial
collection or acute intracranial hemorrhage. Cervicomedullary
junction and pituitary are within normal limits.

Cerebral volume is not significantly changed since 7825. Patchy and
confluent cerebral white matter T2 and FLAIR hyperintensity. There
is now chronic encephalomalacia in the left occipital pole (series
10, image 6). Associated hemosiderin. T2 heterogeneity in the deep
gray matter nuclei is stable. Chronic micro hemorrhage in the
cerebellar hemispheres has progressed since 7825. No other cortical
encephalomalacia.

Negative visualized cervical spine. Bone marrow signal is stable and
within normal limits. Visible internal auditory structures appear
normal. Paranasal sinuses and mastoids are clear. Negative orbit and
scalp soft tissues.

MRA HEAD FINDINGS

Study is mildly degraded by motion artifact despite repeated imaging
attempts.

Dominant distal right vertebral artery. Antegrade flow in the
posterior circulation. The left vertebral appears to functionally
terminate in PICA. No basilar artery stenosis. SCA and left PCA
origin are normal. Fetal type right PCA origin. Negative right PCA
branches. Moderate irregularity and tandem stenosis in the left PCA
P2 segment with preserved distal flow. Left posterior communicating
artery is diminutive or absent.

Antegrade flow in both ICA siphons. Mild to moderate siphon
irregularity but no hemodynamically significant stenosis. Ophthalmic
and right posterior communicating artery origins are within normal
limits. Patent carotid termini. MCA origins, M1 segments, and MCA
bifurcations are within normal limits. No major MCA branch
occlusion. Mild to moderate bilateral MCA branch irregularity.

Mild to moderate bilateral A1 ACA segment irregularity and stenosis.
Diminutive or absent anterior communicating artery. Moderate to
severe bilateral A2 segment stenosis with preserved distal flow.
IMPRESSION: 1.  No acute intracranial abnormality.
2. Chronic left PCA infarct and bilateral cerebellar micro
hemorrhage have progressed since 7825. Chronic small vessel disease.
3. Intracranial atherosclerosis but no major circle of Willis branch
occlusion. Moderate to severe stenoses of the bilateral ACA A2
segments and left PCA P2 segment.
